# Patient Record
Sex: Female | Born: 1940 | Race: White | Hispanic: No | Marital: Married | Smoking: Never smoker
Health system: Southern US, Community
[De-identification: ages and names within clinical notes are randomized; demographics above are authoritative.]

## PROBLEM LIST (undated history)

## (undated) ENCOUNTER — Ambulatory Visit: Admission: EM | Payer: Medicare Other | Source: Home / Self Care

## (undated) DIAGNOSIS — G250 Essential tremor: Secondary | ICD-10-CM

## (undated) DIAGNOSIS — T7840XA Allergy, unspecified, initial encounter: Secondary | ICD-10-CM

## (undated) DIAGNOSIS — C4491 Basal cell carcinoma of skin, unspecified: Secondary | ICD-10-CM

## (undated) DIAGNOSIS — H269 Unspecified cataract: Secondary | ICD-10-CM

## (undated) DIAGNOSIS — C801 Malignant (primary) neoplasm, unspecified: Secondary | ICD-10-CM

## (undated) DIAGNOSIS — N189 Chronic kidney disease, unspecified: Secondary | ICD-10-CM

## (undated) HISTORY — DX: Chronic kidney disease, unspecified: N18.9

## (undated) HISTORY — DX: Essential tremor: G25.0

## (undated) HISTORY — DX: Allergy, unspecified, initial encounter: T78.40XA

## (undated) HISTORY — DX: Basal cell carcinoma of skin, unspecified: C44.91

## (undated) HISTORY — DX: Unspecified cataract: H26.9

## (undated) HISTORY — DX: Malignant (primary) neoplasm, unspecified: C80.1

---
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
## 1971-01-17 HISTORY — PX: DILATION AND CURETTAGE OF UTERUS: SHX78

## 1974-01-16 HISTORY — PX: TUBAL LIGATION: SHX77

## 1996-01-17 HISTORY — PX: BREAST FIBROADENOMA SURGERY: SHX580

## 2000-08-17 ENCOUNTER — Encounter: Payer: Self-pay | Admitting: Emergency Medicine

## 2000-08-17 ENCOUNTER — Emergency Department (HOSPITAL_COMMUNITY): Admission: EM | Admit: 2000-08-17 | Discharge: 2000-08-17 | Payer: Self-pay | Admitting: Emergency Medicine

## 2001-01-16 HISTORY — PX: BASAL CELL CARCINOMA EXCISION: SHX1214

## 2001-01-16 HISTORY — PX: CHOLECYSTECTOMY: SHX55

## 2001-02-06 ENCOUNTER — Encounter: Payer: Self-pay | Admitting: *Deleted

## 2001-02-06 ENCOUNTER — Observation Stay (HOSPITAL_COMMUNITY): Admission: RE | Admit: 2001-02-06 | Discharge: 2001-02-07 | Payer: Self-pay | Admitting: *Deleted

## 2001-02-06 ENCOUNTER — Encounter (INDEPENDENT_AMBULATORY_CARE_PROVIDER_SITE_OTHER): Payer: Self-pay | Admitting: Specialist

## 2001-05-22 ENCOUNTER — Other Ambulatory Visit: Admission: RE | Admit: 2001-05-22 | Discharge: 2001-05-22 | Payer: Self-pay | Admitting: Internal Medicine

## 2003-11-09 ENCOUNTER — Other Ambulatory Visit: Admission: RE | Admit: 2003-11-09 | Discharge: 2003-11-09 | Payer: Self-pay | Admitting: Internal Medicine

## 2003-11-20 ENCOUNTER — Ambulatory Visit: Payer: Self-pay

## 2004-01-17 HISTORY — PX: COLONOSCOPY: SHX174

## 2004-02-08 ENCOUNTER — Ambulatory Visit: Payer: Self-pay | Admitting: Internal Medicine

## 2004-02-18 ENCOUNTER — Ambulatory Visit: Payer: Self-pay | Admitting: Internal Medicine

## 2004-03-07 ENCOUNTER — Ambulatory Visit: Payer: Self-pay | Admitting: Internal Medicine

## 2004-03-17 ENCOUNTER — Ambulatory Visit: Payer: Self-pay | Admitting: Internal Medicine

## 2004-03-31 ENCOUNTER — Ambulatory Visit: Payer: Self-pay | Admitting: Gastroenterology

## 2004-03-31 DIAGNOSIS — K5730 Diverticulosis of large intestine without perforation or abscess without bleeding: Secondary | ICD-10-CM | POA: Insufficient documentation

## 2004-04-14 ENCOUNTER — Ambulatory Visit: Payer: Self-pay | Admitting: Gastroenterology

## 2004-08-28 ENCOUNTER — Emergency Department (HOSPITAL_COMMUNITY): Admission: EM | Admit: 2004-08-28 | Discharge: 2004-08-28 | Payer: Self-pay | Admitting: Emergency Medicine

## 2004-09-01 ENCOUNTER — Ambulatory Visit: Payer: Self-pay | Admitting: Internal Medicine

## 2004-09-22 ENCOUNTER — Ambulatory Visit: Payer: Self-pay | Admitting: Internal Medicine

## 2004-11-28 ENCOUNTER — Ambulatory Visit: Payer: Self-pay | Admitting: Internal Medicine

## 2005-06-30 ENCOUNTER — Ambulatory Visit: Payer: Self-pay | Admitting: Internal Medicine

## 2006-01-16 HISTORY — PX: LITHOTRIPSY: SUR834

## 2006-01-16 HISTORY — PX: CYSTOSCOPY: SUR368

## 2007-01-17 HISTORY — PX: CARDIAC CATHETERIZATION: SHX172

## 2007-07-10 ENCOUNTER — Ambulatory Visit: Payer: Self-pay | Admitting: Cardiology

## 2007-07-10 ENCOUNTER — Observation Stay (HOSPITAL_COMMUNITY): Admission: AD | Admit: 2007-07-10 | Discharge: 2007-07-11 | Payer: Self-pay | Admitting: Cardiology

## 2008-01-17 HISTORY — PX: SIGMOIDOSCOPY: SUR1295

## 2008-04-29 DIAGNOSIS — E042 Nontoxic multinodular goiter: Secondary | ICD-10-CM | POA: Insufficient documentation

## 2008-07-29 ENCOUNTER — Ambulatory Visit: Payer: Self-pay | Admitting: Gastroenterology

## 2008-07-29 DIAGNOSIS — K625 Hemorrhage of anus and rectum: Secondary | ICD-10-CM

## 2008-07-30 ENCOUNTER — Ambulatory Visit: Payer: Self-pay | Admitting: Gastroenterology

## 2010-03-17 DEATH — deceased

## 2010-05-31 NOTE — Discharge Summary (Signed)
[name], [name] NO.:  [id number]   MEDICAL RECORD NO.:  [id number]          PATIENT TYPE:  INP   LOCATION:  2001                         FACILITY:  [facility]   PHYSICIAN:  Veverly Fells. Excell Seltzer, MD  DATE OF BIRTH:  [birth year]   DATE OF ADMISSION:  07/10/2007  DATE OF DISCHARGE:  07/11/2007                               DISCHARGE SUMMARY   PRIMARY CARDIOLOGIST:  Madolyn Frieze. Jens Som, MD, [facility]   PRIMARY CARE Fortune Torosian:  Dalbert Mayotte, MD   DISCHARGE DIAGNOSIS:  Chest pain.   SECONDARY DIAGNOSES:  1. Hyperlipidemia.  2. Depression.  3. Osteoporosis.   ALLERGIES:  No known drug allergies.   PROCEDURE:  Left heart cardiac catheterization.   HISTORY OF PRESENT ILLNESS:  A 70 year old Caucasian female without  prior cardiac history.  She does have a history of hyperlipidemia and  family history of coronary disease.  She was admitted on July 10, 2007,  following a 3-4 weeks history of epigastric and substernal chest  discomfort and heaviness that worsened on the morning of July 10, 2007.  She was seen in the office by Dr. Jens Som and decision was made to  admit her for further evaluation.   HOSPITAL COURSE:  The patient had recurrent episodes of chest discomfort  on the evening of admission.  ECG showed no acute changes and cardiac  markers remained negative.  Decision was made to perform cardiac  catheterization.  Catheterization was performed on July 11, 2007,  revealing normal coronary arteries with an EF of 65-70% with normal wall  motion.  Post catheterization, [name] has been ambulating without  recurrent symptoms or limitations.  We will recommend her that she  follow up with Dr. Drue Second in the next 1-2 weeks as she may require  outpatient GI evaluation.  She is; otherwise, being discharged home  today in good condition.   DISCHARGE LABS:  Hemoglobin 13.1, hematocrit 40.0, WBC 6.1, platelets  195, and MCV 87.0.  Sodium 140, potassium 3.7, chloride 109,  CO2 of 25,  BUN 10, and creatinine 0.71, and glucose 125.  INR 0.9, total bilirubin  0.8, alkaline phosphatase 80, AST 31, ALT 23, and albumin 3.7.  Cardiac  markers negative x2.  Calcium 9.3.  TSH 0.732.   DISPOSITION:  The patient is being discharged home today in good  condition.   FOLLOWUP PLANS AND APPOINTMENTS:  We have asked her to followup with Dr.  Dalbert Mayotte in the next 1-2 weeks.   DISCHARGE MEDICATIONS:  1. Zoloft 100 mg daily.  2. Vytorin 40/10 mg daily.  3. Fosamax 70 mg weekly.  4. Aspirin 81 mg daily.  5. Prilosec OTC 20 mg daily.   OUTSTANDING LAB STUDIES:  None.   DURATION OF DISCHARGE ENCOUNTERED:  A 40 minutes including physician  time.      [name], [name]      Veverly Fells. Excell Seltzer, MD  Electronically Signed    CB/MEDQ  D:  07/11/2007  T:  07/12/2007  Job:  [id number]   cc:   Dalbert Mayotte, M.D.

## 2010-05-31 NOTE — H&P (Signed)
[name], [name] NO.:  [id number]   MEDICAL RECORD NO.:  [id number]          PATIENT TYPE:  INP   LOCATION:  2001                         FACILITY:  [facility]   PHYSICIAN:  Madolyn Frieze. Jens Som, MD, FACCDATE OF BIRTH:  1940/03/09   DATE OF ADMISSION:  07/10/2007  DATE OF DISCHARGE:                              HISTORY & PHYSICAL   HISTORY OF PRESENT ILLNESS:  [name] is a very pleasant 70 year old  female who presents for evaluation of chest pain.  She has no prior  cardiac history.  She typically does not have dyspnea on exertion,  orthopnea, PND, pedal edema, palpitations, presyncope, syncope, or  exertional chest pain.  Over the past 3-4 weeks, she has had  intermittent epigastric/substernal chest pain.  It is described as  someone sitting on my chest.  The pain is not pleuritic or positional  nor it is related to food.  It is not exertional.  The pain does not  radiate.  There is no associated nausea, vomiting, shortness of breath,  or diaphoresis.  It can last anywhere from 10-15 minutes to 5 minutes.  Her most recent episode was this morning for approximately 5-10 minutes.  She apparently saw Dr. Ilsa Iha for these pains and had chest x-ray that  questioned pneumonia.  However, followup CAT scan was normal per the  patient's report.  I do not have those records available.  Note, she has  not had any recent trips or leg injury.  Because of the above, I was  asked to further evaluate.   MEDICATIONS:  Her medications at present include:  1. Fosamax 70 mg weekly.  2. Sertraline 100 mg p.o. daily.  3. Zocor 40 mg p.o. daily.   ALLERGIES:  She has no known drug allergies.   SOCIAL HISTORY:  She does not smoke.  She occasionally consumes alcohol.   FAMILY HISTORY:  Positive for coronary disease.  She had a brother who  had a myocardial infarction in his 68s.  Her father had myocardial  infarction in his late 54s.   PAST MEDICAL HISTORY:  There is no diabetes  mellitus or hypertension,  but there is hyperlipidemia.  She has had problems with reflux in the  past, as well as allergies.  She has had prior nephrolithiasis.  She has  had a prior basal cell carcinoma and history of depression.  She has had  a history of an appendectomy, tonsillectomy, tubal ligation, and  cholecystectomy.  She has had prior benign calcified mass removed from  her breast.   REVIEW OF SYSTEMS:  There is no headaches recently.  No fevers or  chills.  There is no productive cough or hemoptysis.  There is no  dysphagia, odynophagia, or hematochezia.  She has had a question of  black stools approximately 2 weeks ago.  There is no dysuria or  hematuria.  There is no rash or seizure activity.  There is no  orthopnea, PND or pedal edema.  There is no claudication noted.  Remaining systems are negative.   PHYSICAL EXAMINATION:  VITAL SIGNS:  Today shows a blood  pressure of  129/80 and pulse of 98.  She weighs 168 pounds.  GENERAL:  She is well-developed, well-nourished in no acute distress.  SKIN.  Skin is warm and dry.  She does not appear to be depressed.  There is no peripheral clubbing.  BACK:  Normal.  HEENT:  Normal with normal eyelids.  NECK:  Supple with a normal upstroke bilaterally.  She does have a left  carotid bruit.  There is no thyromegaly noted.  CHEST:  Clear to auscultation with normal expansion.  CARDIOVASCULAR:  Regular rate and regular rhythm.  There is a 1/6  systolic murmur at the lower left sternal border.  There is no S3 or S4.  There are no rubs noted.  ABDOMEN:  Nontender.  Positive bowel sounds.  No hepatosplenomegaly.  No  masses appreciated.  There is no abdominal bruit.  She has 2+ femoral  pulses bilaterally.  No bruits.  EXTREMITIES:  Show no edema that I can palpate.  No cords.  She has 2+  dorsalis pedis pulses bilaterally.  NEUROLOGIC:  Grossly intact.   Her electrocardiogram shows a sinus rhythm with occasional PACs.  There  are no  ST changes noted.   DIAGNOSIS:  1. Chest pain - [name]'s symptoms have both typical and atypical      features; however, she does have risk factors including a family      history with a brother having myocardial infarction in his early      17s.  She also has hyperlipidemia.  Her most recent episode was      this morning, and she has had multiple episodes over the past 3-4      weeks.  I therefore think the most prudent course of action would      be to admit the patient to [facility] and rule out      myocardial infarction with serial enzymes.  If her enzymes are      negative, then we will most likely risk stratify with a stress      Myoview.  If she did have recurrent symptoms, then I would favor      cardiac catheterization.  We will treat with aspirin and Lopressor,      but she will continue on her statin.  Note, she has also had 2      black stools 2 weeks ago.  We will add Protonix to medical regimen,      certainly a gastrointestinal etiology is possible.  We will plan to      guaiac all stool and she may need a gastrointestinal evaluation if      her cardiac workup is negative.  2. Hyperlipidemia - she will continue on her statin.  3. Left carotid bruit - I think she will need outpatient carotid      Dopplers.   We will make further recommendations once we have the results of her  enzymes.      Madolyn Frieze Jens Som, MD, [facility]  Electronically Signed     BSC/MEDQ  D:  07/10/2007  T:  07/11/2007  Job:  [id number]

## 2010-06-03 NOTE — Op Note (Signed)
[facility]  Patient:    [name], [name] Visit Number: [id number] MRN: [id number]          Service Type: SUR Location: 4W 0449 01 Attending Physician:  Kandis Mannan Dictated by:   Donnie Coffin Samuella Cota, M.D. Proc. Date: 02/06/01 Admit Date:  02/06/2001   CC:         Stacie Glaze, M.D. [facility]   Operative Report  CCS#:  [id number]  PREOPERATIVE DIAGNOSIS:  Chronic cholecystitis with cholelithiasis.  POSTOPERATIVE DIAGNOSIS:  Chronic cholecystitis with cholelithiasis.  OPERATION PERFORMED:  Laparoscopic cholecystectomy with operative cholangiogram.  SURGEON:  Maisie Fus B. Samuella Cota, M.D.  ASSISTANT:  Gita Kudo, M.D.  ANESTHESIA:  General, Dr. Almeta Monas and CRNA.  DESCRIPTION OF PROCEDURE:  The patient was taken to the operating room, placed on the table in supine position, and after satisfactory general anesthetic with intubation, the entire abdomen was prepped and draped as a sterile field. A small vertical infraumbilical incision was made through skin and subcutaneous tissue and midline fascia. The peritoneal cavity was entered and there were no adhesions to the anterior abdominal wall. A pursestring suture of #0 Vicryl was placed and the Hasson trocar placed into the abdomen. The abdomen was insufflated to 14 mmHg pressure. A second 10 mm trocar was placed just to the right of the midline and subxiphoid area. Two 5 mm trocars were placed laterally. The gallbladder was covered with omentum and adhesions and these were taken down with gentle dissection. The patient had some adhesions to the undersurface of the liver medially which had to be taken down also. The cystic duct was then dissected free and appeared fairly long. A hemoclip was placed on the gallbladder side of the cystic duct and a small opening made into the cystic duct. [facility] was introduced through the abdominal wall and placed into the cystic duct and held with  endoclips. Using real-time C-arm fluoroscopy, operative cholangiogram was carried out which revealed good filling of the entire biliary system with good spillage of the contrast into the duodenum. The patient had a fairly long cystic duct. After the cholangiogram had been reviewed, the cholangiocath was removed and the cystic duct triply clipped on the remaining side and divided. The patient had a couple of small arteries which were clipped and then a large cystic artery was identified which was doubly clipped and divided. The gallbladder was dissected from the bed and bleeding controlled with the cautery. The gallbladder was easily removed through the infraumbilical incision. The two lateral trocars were removed after the fluid in the right upper quadrant had been suctioned. The pursestring suture at the infraumbilical incision was tied, a 0.25% Marcaine without epinephrine had been injected in all trocar sites. The last 10 mm trocar was removed and the skin incisions were closed. The two midline incisions were closed with running subcuticular sutures of 4-0 Vicryl and the two lateral trocar sites were closed with a single simple inverted 4-0 Vicryl. Benzoin and 0.25 inch Steri-Strips were applied. The patient seemed to tolerate the procedure well and was taken to the PACU in satisfactory condition. Dictated by:   Donnie Coffin Samuella Cota, M.D. Attending Physician:  Kandis Mannan DD:  02/06/01 TD:  02/07/01 Job: [id number]

## 2010-10-13 LAB — COMPREHENSIVE METABOLIC PANEL
AST: 31
BUN: 10
CO2: 25
Calcium: 9.3
Creatinine, Ser: 0.71
GFR calc Af Amer: >60
GFR calc non Af Amer: >60

## 2010-10-13 LAB — CBC
HCT: 40
MCHC: 34.4
MCV: 87
Platelets: 195
RBC: 4.59

## 2010-10-13 LAB — CARDIAC PANEL(CRET KIN+CKTOT+MB+TROPI)
CK, MB: 1.8
Relative Index: INVALID
Troponin I: 0.02
Troponin I: 0.03

## 2010-10-13 LAB — TSH: TSH: 0.732

## 2010-10-13 LAB — PROTIME-INR: INR: 0.9

## 2011-03-01 ENCOUNTER — Encounter: Payer: Self-pay | Admitting: Gastroenterology

## 2012-08-05 DIAGNOSIS — E782 Mixed hyperlipidemia: Secondary | ICD-10-CM | POA: Insufficient documentation

## 2013-01-01 DIAGNOSIS — M810 Age-related osteoporosis without current pathological fracture: Secondary | ICD-10-CM | POA: Insufficient documentation

## 2013-01-16 HISTORY — PX: LITHOTRIPSY: SUR834

## 2013-01-16 HISTORY — PX: BLEPHAROPLASTY: SUR158

## 2013-02-10 DIAGNOSIS — R0989 Other specified symptoms and signs involving the circulatory and respiratory systems: Secondary | ICD-10-CM | POA: Insufficient documentation

## 2013-08-18 DIAGNOSIS — N200 Calculus of kidney: Secondary | ICD-10-CM | POA: Insufficient documentation

## 2013-12-17 DIAGNOSIS — N201 Calculus of ureter: Secondary | ICD-10-CM | POA: Insufficient documentation

## 2014-03-04 ENCOUNTER — Encounter: Payer: Self-pay | Admitting: Gastroenterology

## 2014-03-19 ENCOUNTER — Encounter: Payer: Self-pay | Admitting: Gastroenterology

## 2014-04-06 ENCOUNTER — Ambulatory Visit (AMBULATORY_SURGERY_CENTER): Payer: Medicare Other | Admitting: *Deleted

## 2014-04-06 VITALS — Ht 65.0 in | Wt 159.0 lb

## 2014-04-06 DIAGNOSIS — Z1211 Encounter for screening for malignant neoplasm of colon: Secondary | ICD-10-CM

## 2014-04-06 MED ORDER — NA SULFATE-K SULFATE-MG SULF 17.5-3.13-1.6 GM/177ML PO SOLN: 1.0000 | Freq: Once | ORAL | Status: DC

## 2014-04-06 NOTE — Progress Notes (Signed)
No egg or soy allergy No home 02 use No diet pills No issues with past sedation, no issues with intubation  emmi video to e mail

## 2014-04-17 ENCOUNTER — Telehealth: Payer: Self-pay | Admitting: Gastroenterology

## 2014-04-17 NOTE — Telephone Encounter (Signed)
Called patient and explained that prescription had only been ordered one time on this end, not sure why she was notified it had been reordered. She stated that it was a "robocall", offered to call pharmacy and let them know we did not reorder as patient has already picked up, she states she is fine with just letting it be and they will eventually restock.

## 2014-04-20 ENCOUNTER — Encounter: Payer: Self-pay | Admitting: Gastroenterology

## 2014-04-20 ENCOUNTER — Ambulatory Visit (AMBULATORY_SURGERY_CENTER): Payer: Medicare Other | Admitting: Gastroenterology

## 2014-04-20 VITALS — BP 120/75 | HR 78 | Temp 96.7°F | Resp 13 | Ht 65.0 in | Wt 159.0 lb

## 2014-04-20 DIAGNOSIS — Z1211 Encounter for screening for malignant neoplasm of colon: Secondary | ICD-10-CM

## 2014-04-20 DIAGNOSIS — K5730 Diverticulosis of large intestine without perforation or abscess without bleeding: Secondary | ICD-10-CM

## 2014-04-20 MED ORDER — SODIUM CHLORIDE 0.9 % IV SOLN: 500.0000 mL | INTRAVENOUS | Status: DC

## 2014-04-20 NOTE — Progress Notes (Signed)
Report to PACU, RN, vss, BBS= Clear.  

## 2014-04-20 NOTE — Op Note (Signed)
[place]  [address] [place], [id number]   COLONOSCOPY PROCEDURE REPORT  PATIENT: [name], [name]  MR#: [id number] BIRTHDATE: 06/03/1940 , 74  yrs. old GENDER: female ENDOSCOPIST: Inda Castle, MD REFERRED BY: PROCEDURE DATE:  04/20/2014 PROCEDURE:   Colonoscopy, screening First Screening Colonoscopy - Avg.  risk and is 50 yrs.  old or older - No.  Prior Negative Screening - Now for repeat screening. 10 or more years since last screening  History of Adenoma - Now for follow-up colonoscopy & has been > or = to 3 yrs.  N/A ASA CLASS:   Class II INDICATIONS:Colorectal Neoplasm Risk Assessment for this procedure is average risk. MEDICATIONS: Monitored anesthesia care and Propofol 230 mg IV  DESCRIPTION OF PROCEDURE:   After the risks benefits and alternatives of the procedure were thoroughly explained, informed consent was obtained.  The digital rectal exam revealed no abnormalities of the rectum.   The LB [id number]  endoscope was introduced through the anus and advanced to the ileum. No adverse events experienced.   The quality of the prep was (Suprep was used) excellent.  The instrument was then slowly withdrawn as the colon was fully examined.      COLON FINDINGS: There was severe diverticulosis noted in the descending colon and sigmoid colon with associated luminal narrowing and muscular hypertrophy.   Internal hemorrhoids were found.   The examination was otherwise normal.  Retroflexed views revealed no abnormalities. The time to cecum = 6.7 Withdrawal time = 7.2   The scope was withdrawn and the procedure completed. COMPLICATIONS: There were no immediate complications.  ENDOSCOPIC IMPRESSION: 1.   There was severe diverticulosis noted in the descending colon and sigmoid colon 2.   Internal hemorrhoids 3.   The examination was otherwise normal  RECOMMENDATIONS: Given your age, you will not need another colonoscopy for colon cancer  screening or polyp surveillance.  These types of tests usually stop around the age 48.  eSigned:  Inda Castle, MD 04/20/2014 2:44 PM   cc: Julie Martinique, MD

## 2014-04-20 NOTE — Patient Instructions (Signed)
YOU HAD AN ENDOSCOPIC PROCEDURE TODAY AT [facility]:   Refer to the procedure report that was given to you for any specific questions about what was found during the examination.  If the procedure report does not answer your questions, please call your gastroenterologist to clarify.  If you requested that your care partner not be given the details of your procedure findings, then the procedure report has been included in a sealed envelope for you to review at your convenience later.  YOU SHOULD EXPECT: Some feelings of bloating in the abdomen. Passage of more gas than usual.  Walking can help get rid of the air that was put into your GI tract during the procedure and reduce the bloating. If you had a lower endoscopy (such as a colonoscopy or flexible sigmoidoscopy) you may notice spotting of blood in your stool or on the toilet paper. If you underwent a bowel prep for your procedure, you may not have a normal bowel movement for a few days.  Please Note:  You might notice some irritation and congestion in your nose or some drainage.  This is from the oxygen used during your procedure.  There is no need for concern and it should clear up in a day or so.  SYMPTOMS TO REPORT IMMEDIATELY:   Following lower endoscopy (colonoscopy or flexible sigmoidoscopy):  Excessive amounts of blood in the stool  Significant tenderness or worsening of abdominal pains  Swelling of the abdomen that is new, acute  Fever of 100F or higher    For urgent or emergent issues, a gastroenterologist can be reached at any hour by calling [phone].   DIET: Your first meal following the procedure should be a small meal and then it is ok to progress to your normal diet. Heavy or fried foods are harder to digest and may make you feel nauseous or bloated.  Likewise, meals heavy in dairy and vegetables can increase bloating.  Drink plenty of fluids but you should avoid alcoholic beverages for 24  hours.  ACTIVITY:  You should plan to take it easy for the rest of today and you should NOT DRIVE or use heavy machinery until tomorrow (because of the sedation medicines used during the test).    FOLLOW UP: Our staff will call the number listed on your records the next business day following your procedure to check on you and address any questions or concerns that you may have regarding the information given to you following your procedure. If we do not reach you, we will leave a message.  However, if you are feeling well and you are not experiencing any problems, there is no need to return our call.  We will assume that you have returned to your regular daily activities without incident.  If any biopsies were taken you will be contacted by phone or by letter within the next 1-3 weeks.  Please call us at [phone] if you have not heard about the biopsies in 3 weeks.    SIGNATURES/CONFIDENTIALITY: You and/or your care partner have signed paperwork which will be entered into your electronic medical record.  These signatures attest to the fact that that the information above on your After Visit Summary has been reviewed and is understood.  Full responsibility of the confidentiality of this discharge information lies with you and/or your care-partner.  INFORMATION ON DIVERTICULOSIS ,HEMORRHOIDS ,& HIGH FIBER DIET GIVEN TO YOU TODAY

## 2014-04-21 ENCOUNTER — Telehealth: Payer: Self-pay | Admitting: *Deleted

## 2014-04-21 NOTE — Telephone Encounter (Signed)
  Follow up Call-  Call back number 04/20/2014  Post procedure Call Back phone  # [phone]  Permission to leave phone message Yes     Patient questions:  Do you have a fever, pain , or abdominal swelling? No. Pain Score  0 *  Have you tolerated food without any problems? Yes.    Have you been able to return to your normal activities? Yes.    Do you have any questions about your discharge instructions: Diet   No. Medications  No. Follow up visit  No.  Do you have questions or concerns about your Care? No.  Actions: * If pain score is 4 or above: No action needed, pain <4.

## 2014-08-06 ENCOUNTER — Encounter: Payer: Self-pay | Admitting: Gastroenterology

## 2018-01-16 DEATH — deceased

## 2018-08-12 DIAGNOSIS — F325 Major depressive disorder, single episode, in full remission: Secondary | ICD-10-CM | POA: Insufficient documentation

## 2019-03-29 ENCOUNTER — Emergency Department
Admission: EM | Admit: 2019-03-29 | Discharge: 2019-03-29 | Disposition: A | Discharge status: Home / Self Care | Payer: Medicare Other | Source: Home / Self Care | Attending: Family Medicine | Admitting: Family Medicine

## 2019-03-29 ENCOUNTER — Emergency Department (INDEPENDENT_AMBULATORY_CARE_PROVIDER_SITE_OTHER): Payer: Medicare Other

## 2019-03-29 ENCOUNTER — Other Ambulatory Visit: Payer: Self-pay

## 2019-03-29 DIAGNOSIS — S52501A Unspecified fracture of the lower end of right radius, initial encounter for closed fracture: Secondary | ICD-10-CM

## 2019-03-29 NOTE — ED Triage Notes (Signed)
Pt c/o RT wrist pain since last night after falling at home. Pain 5/10.

## 2019-03-29 NOTE — Discharge Instructions (Addendum)
Elevate hand/wrist.  Wear brace.  Apply ice pack for 20 to 30 minutes, 3 to 4 times daily  Continue until pain and swelling decrease.  May take Tylenol as needed for pain.  Discuss vitamin D management with your family doctor.

## 2019-03-29 NOTE — ED Provider Notes (Signed)
[name] C[facility]    CSN: [id number] Arrival date & time: 03/29/19  1013      History   Chief Complaint Chief Complaint  Patient presents with   Wrist Pain    RT    HPI [name] is a 79 y.o. female.   While beginning to sit in her computer chair last night, the chair rolled sideways and patient fell.  While bracing with her right hand, she had sudden pain in her right wrist that has persisted.  The history is provided by the patient.  Wrist Pain Episode onset: 12 hours ago. The problem occurs constantly. The problem has not changed since onset.Associated symptoms comments: Decreased range of motion wrist. Exacerbated by: right wrist movement. Nothing relieves the symptoms. She has tried nothing for the symptoms.    Past Medical History:  Diagnosis Date   Allergy    Cancer ([place])    basal cell    Cataract    beginning   Chronic kidney disease    kidney stones    Patient Active Problem List   Diagnosis Date Noted   HEMORRHAGE OF RECTUM AND ANUS 07/29/2008   DIVERTICULOSIS OF COLON 03/31/2004    Past Surgical History:  Procedure Laterality Date   APPENDECTOMY  1962   BASAL CELL CARCINOMA EXCISION  2003   BLEPHAROPLASTY  2015   upper/lower    [place]   CARDIAC CATHETERIZATION  2009   CHOLECYSTECTOMY  2003   COLONOSCOPY  2006   CYSTOSCOPY  2008   with stent   [place]   LITHOTRIPSY  2008   LITHOTRIPSY  2015   SIGMOIDOSCOPY  2010   [place]    OB History   No obstetric history on file.      Home Medications    Prior to Admission medications   Medication Sig Start Date End Date Taking? Authorizing Provider  aspirin EC 81 MG tablet Take 81 mg by mouth. 08/06/12   [provider]  calcium-vitamin D (OSCAL) 250-125 MG-UNIT per tablet Take 1,200 mg by mouth.    [provider]  Co-Enzyme Q-10 30 MG CAPS Take 30 mg  by mouth.    [provider]  Cyanocobalamin (B-12) 1000 MCG CAPS Take 500 mcg by mouth.    [provider]  FLUoxetine (PROZAC) 40 MG capsule Take 40 mg by mouth. 08/20/13   [provider]  Glucosamine HCl-MSM (GLUCOSAMINE-MSM) 375-250 MG TABS Take 1,500 mg by mouth.    [provider]  Magnesium Sulfate 70 MG CAPS Take 500 mg by mouth.    [provider]  Multiple Vitamin (MULTIVITAMIN) tablet Take 1 tablet by mouth.    [provider]  simvastatin (ZOCOR) 20 MG tablet Take 20 mg by mouth.    [provider]  UNABLE TO FIND HAIR-SKIN-NAILS VITAMIN    [provider]  UNABLE TO FIND Tumeric 1 tab qd    [provider]  UNABLE TO FIND Vision Essentials 1 tab tid    [provider]    Family History Family History  Problem Relation Age of Onset   Heart disease Mother    Diabetes Mother    Kidney disease Mother    Hypertension Mother    Heart disease Father 30   Colon cancer Paternal Aunt    Heart disease Paternal Uncle 38        passed  away 79    Heart disease Paternal Grandmother    Heart disease Paternal Grandfather    Heart disease Paternal Uncle    Heart disease Maternal Aunt    Heart disease Maternal Uncle    Brain cancer Maternal Aunt     Social History Social History   Tobacco Use   Smoking status: Never Smoker   Smokeless tobacco: Never Used  Substance Use Topics   Alcohol use: Yes    Alcohol/week: 0.0 standard drinks    Comment: occ glass of wine   Drug use: No     Allergies   Other   Review of Systems Review of Systems  Musculoskeletal: Positive for joint swelling.  Skin: Negative for color change and wound.  All other systems reviewed and are negative.    Physical Exam Triage Vital Signs ED Triage Vitals [03/29/19 1025]  Enc Vitals Group     BP 123/78     Pulse Rate 82     Resp 18     Temp 97.9 F (36.6 C)     Temp Source Oral     SpO2  97 %     Weight 163 lb (73.9 kg)     Height 5\' 5"  (1.651 m)     Head Circumference      Peak Flow      Pain Score 5     Pain Loc      Pain Edu?      Excl. in [place]?    No data found.  Updated Vital Signs BP 123/78 (BP Location: Right Arm)    Pulse 82    Temp 97.9 F (36.6 C) (Oral)    Resp 18    Ht 5\' 5"  (1.651 m)    Wt 73.9 kg    SpO2 97%    BMI 27.12 kg/m   Visual Acuity Right Eye Distance:   Left Eye Distance:   Bilateral Distance:    Right Eye Near:   Left Eye Near:    Bilateral Near:     Physical Exam Vitals and nursing note reviewed.  Constitutional:      General: She is not in acute distress. HENT:     Head: Atraumatic.     Nose: Nose normal.  Eyes:     Pupils: Pupils are equal, round, and reactive to light.  Cardiovascular:     Rate and Rhythm: Normal rate.  Pulmonary:     Effort: Pulmonary effort is normal.  Musculoskeletal:     Right wrist: Swelling, tenderness, bony tenderness and snuff box tenderness present. No deformity, lacerations or crepitus. Decreased range of motion. Normal pulse.       Hands:     Comments: Right wrist has decreased range of motion.  There is tenderness to palpation and mild swelling over the dorsal distal radius.  Mild tenderness over the snuffbox.  Distal neurovascular function is intact.   Skin:    General: Skin is warm and dry.  Neurological:     Mental Status: She is alert.      UC Treatments / Results  Labs (all labs ordered are listed, but only abnormal results are displayed) Labs Reviewed - No data to display  EKG   Radiology DG Wrist Complete Right  Result Date: 03/29/2019 CLINICAL DATA:  Pt c/o RT wrist pain since last night after falling at home. Pain is around the distal radius and radiates up midshaft of the forearm. EXAM: RIGHT WRIST - COMPLETE 3+ VIEW COMPARISON:  None. FINDINGS:  They are subtle cortical step-offs on the lateral view along the dorsal and volar margins of the distal radial metaphysis, with  transverse trabecular irregularity also noted crossing the distal radial metaphysis on the AP and obliques views. Findings are consistent with a subtle nondisplaced, non comminuted fracture. No other evidence of a fracture.  No bone lesion. There are degenerative changes at the scaphoid, trapezium, trapezoid articulation. Remaining joints are normally spaced and aligned. Skeletal structures are demineralized. Mild wrist soft tissue swelling. IMPRESSION: 1. Subtle nondisplaced, non comminuted fracture of the distal right radial metaphysis. No dislocation. Electronically Signed   By: Lajean Manes M.D.   On: 03/29/2019 11:17    Procedures Procedures (including critical care time)  Medications Ordered in UC Medications - No data to display  Initial Impression / Assessment and Plan / UC Course  I have reviewed the triage vital signs and the nursing notes.  Pertinent labs & imaging results that were available during my care of the patient were reviewed by me and considered in my medical decision making (see chart for details).    Wrist splint applied. Review of records reveals last Vitamin D 25=OH 24ng/mL on 08/13/18. Followup with Dr. Aundria Mems ([facility]) for fracture management.   Final Clinical Impressions(s) / UC Diagnoses   Final diagnoses:  Closed fracture of distal end of right radius, unspecified fracture morphology, initial encounter     Discharge Instructions     Elevate hand/wrist.  Wear brace.  Apply ice pack for 20 to 30 minutes, 3 to 4 times daily  Continue until pain and swelling decrease.  May take Tylenol as needed for pain.  Discuss vitamin D management with your family doctor.    ED Prescriptions    None        Kandra Nicolas, MD 03/29/19 1203

## 2019-04-01 ENCOUNTER — Ambulatory Visit (INDEPENDENT_AMBULATORY_CARE_PROVIDER_SITE_OTHER): Payer: Medicare Other | Admitting: Sports Medicine

## 2019-04-01 ENCOUNTER — Other Ambulatory Visit: Payer: Self-pay

## 2019-04-01 DIAGNOSIS — S52591A Other fractures of lower end of right radius, initial encounter for closed fracture: Secondary | ICD-10-CM

## 2019-04-01 NOTE — Progress Notes (Signed)
    Procedures performed today:    None.  Independent interpretation of notes and tests performed by another provider:   I personally reviewed her x-rays, they show a nondisplaced extra-articular fracture through the right distal radial metaphysis.  Impression and Recommendations:    Closed fracture of metaphysis of distal end of right radius 4 days ago fell onto an outstretched right hand, had immediate pain, swelling, bruising. Seen in urgent care where x-rays showed a nondisplaced, nonangulated fracture of her right distal radial metaphysis. There is really no swelling so we placed her in an Exos cast today with plans for 4 to 6 weeks of casting. Return to see me in 1 month, x-ray before visit, we will consider some time out of the cast or transition back into her Velcro brace at that time.  I billed a fracture code for this encounter, all subsequent visits will be post-op checks in the global period.    ___________________________________________ Gwen Her. Dianah Field, M.D., ABFM., CAQSM. Primary Care and [place] Instructor of [place] of [facility] [employer]

## 2019-04-01 NOTE — Assessment & Plan Note (Signed)
4 days ago fell onto an outstretched right hand, had immediate pain, swelling, bruising. Seen in urgent care where x-rays showed a nondisplaced, nonangulated fracture of her right distal radial metaphysis. There is really no swelling so we placed her in an Exos cast today with plans for 4 to 6 weeks of casting. Return to see me in 1 month, x-ray before visit, we will consider some time out of the cast or transition back into her Velcro brace at that time.  I billed a fracture code for this encounter, all subsequent visits will be post-op checks in the global period.

## 2019-04-01 NOTE — Patient Instructions (Signed)
Radial Fracture  A radial fracture is a break in the radius bone. The radius is a bone in the forearm, on the same side as the thumb. The forearm is the part of the arm that is between the elbow and the wrist. A radial fracture near the wrist (distal radialfracture) is the most common type of broken arm. A fracture can also occur near the elbow (radial head fracture). What are the causes? The most common cause of a radial fracture is falling with the arm outstretched. Other causes include:  An accident, such as a car or bike accident.  A hard, direct hit to the forearm. What increases the risk? You may be at greater risk for a radial fracture if you:  Are female.  Are an older adult.  Play contact sports.  Have a condition that causes your bones to become thin and brittle (osteoporosis). What are the signs or symptoms? A radial fracture causes pain immediately after the injury. Other signs and symptoms may include:  An abnormal bend or bump in the arm (deformity).  Swelling.  Bruising.  Numbness or tingling in your arm and hand.  Limited movement of your arm and hand. How is this diagnosed? This condition may be diagnosed based on:  Your symptoms and medical history.  A physical exam.  An X-ray. How is this treated? Treatment depends on how severe your fracture is, where it is, and how the pieces of the broken bone line up with each other (alignment).  The first step may be for you to wear a temporary splint for a few days, until your swelling goes down. After the swelling goes down, you may get a cast, get a different type of splint, or have surgery.  If your broken bone is in good alignment, you will need to wear a splint or cast for up to 6 weeks.  If your broken bone is not aligned (is displaced), your health care provider will need to align the bone pieces. After alignment, you will need to wear a splint or cast for up to 6 weeks. To align your broken bone, your  health care provider may: ? Move the bones back into position without surgery (closed reduction). ? Perform surgery to align the fracture and fix the bone pieces into place with metal screws, plates, or wires (open reduction and internal fixation, ORIF). ? Perform surgery to align the fracture and fix the bone pieces into place with pins that are attached to a stabilizing bar outside your skin (external fixation). Treatment may also include:  Having your cast changed after 2-3 weeks.  Physical therapy.  Follow-up visits and X-rays to make sure you are healing. Follow these instructions at home: If you have a splint:  Wear it as told by your health care provider. Remove it only as told by your health care provider.  Loosen the splint if your fingers tingle, become numb, or turn cold and blue.  Keep the splint clean and dry. If you have a cast:  Do not stick anything inside the cast to scratch your skin. Doing that increases your risk for infection.  Check the skin around the cast every day. Tell your health care provider about any concerns.  You may put lotion on dry skin around the edges of the cast. Do not put lotion on the skin underneath the cast.  Keep the cast clean and dry. Bathing  Do not take baths, swim, or use a hot tub until your health care  provider approves. Ask your health care provider if you may take showers. You may only be allowed to take sponge baths.  If your splint or cast is not waterproof: ? Do not let it get wet. ? Cover it with a watertight covering when you take a bath or a shower. Activity  Do not lift anything with your injured arm.  Do not use the injured arm to support your body weight until your health care provider says that you can.  Ask your health care provider what activities are safe for you during recovery, and ask what activities you need to avoid. Managing pain, stiffness, and swelling   If directed, put ice on painful areas: ? If  you have a removable splint, remove it as told by your health care provider. ? Put ice in a plastic bag. ? Place a towel between your skin and the bag, or between your cast and the bag. ? Leave the ice on for 20 minutes, 2-3 times a day.  Move your fingers often to avoid stiffness and to lessen swelling.  Raise (elevate) your arm above the level of your heart while you are sitting or lying down. General instructions  Do not put pressure on any part of the cast or splint until it is fully hardened, if applicable. This may take several hours.  Take over-the-counter and prescription medicines only as told by your health care provider.  Do not drive until your health care provider approves. You should not drive or use heavy machinery while taking prescription pain medicine.  Do not use any products that contain nicotine or tobacco, such as cigarettes and e-cigarettes. These can delay bone healing. If you need help quitting, ask your health care provider.  Keep all follow-up visits as told by your health care provider. This is important. Contact a health care provider if you have:  Pain that does not get better with medicine.  Swelling that gets worse.  A bad smell coming from your cast. Get help right away if:  You cannot move your fingers.  You have severe pain.  Your fingers or your hand: ? Become numb, cold, or pale. ? Turn a bluish color. Summary  A radial fracture is a break in the radius bone. The radius is in the forearm, on the same side as the thumb.  Treatment depends on how severe your fracture is, where it is, and how the pieces of the broken bone line up with each other.  A splint or cast may be needed to help the fracture heal. A more severe break may require surgery. This information is not intended to replace advice given to you by your health care provider. Make sure you discuss any questions you have with your health care provider. Document Revised: 12/27/2016  Document Reviewed: 12/27/2016 Elsevier Patient Education  2020 [employer].

## 2019-04-29 ENCOUNTER — Ambulatory Visit (INDEPENDENT_AMBULATORY_CARE_PROVIDER_SITE_OTHER): Payer: Medicare Other

## 2019-04-29 ENCOUNTER — Other Ambulatory Visit: Payer: Self-pay

## 2019-04-29 ENCOUNTER — Ambulatory Visit (INDEPENDENT_AMBULATORY_CARE_PROVIDER_SITE_OTHER): Payer: Medicare Other | Admitting: Sports Medicine

## 2019-04-29 DIAGNOSIS — S52591A Other fractures of lower end of right radius, initial encounter for closed fracture: Secondary | ICD-10-CM | POA: Diagnosis not present

## 2019-04-29 NOTE — Progress Notes (Signed)
   Impression and Recommendations:    I have performed independent interpretation of the relevant labs and imaging ordered by this patient's other providers.  Closed fracture of metaphysis of distal end of right radius This is a pleasant 79 year old female, she is now 4 weeks post fracture of the radial metaphysis, still hurting significantly, x-rays reviewed and are unchanged with the exception of a decreased fracture line. Continue Exos cast for at least another 2 weeks, return to see me, no x-ray needed. I did reassure her that these fractures can take 6 to 8 weeks to heal. No pain medication requested.    ___________________________________________ Gwen Her. Dianah Field, M.D., ABFM., CAQSM. Primary Care and Sports Medicine [facility]  Adjunct Professor of [place] of [facility] [employer]

## 2019-04-29 NOTE — Assessment & Plan Note (Signed)
This is a pleasant 79 year old female, she is now 4 weeks post fracture of the radial metaphysis, still hurting significantly, x-rays reviewed and are unchanged with the exception of a decreased fracture line. Continue Exos cast for at least another 2 weeks, return to see me, no x-ray needed. I did reassure her that these fractures can take 6 to 8 weeks to heal. No pain medication requested.

## 2019-05-13 ENCOUNTER — Ambulatory Visit (INDEPENDENT_AMBULATORY_CARE_PROVIDER_SITE_OTHER): Payer: Medicare Other

## 2019-05-13 ENCOUNTER — Ambulatory Visit (INDEPENDENT_AMBULATORY_CARE_PROVIDER_SITE_OTHER): Payer: Medicare Other | Admitting: Sports Medicine

## 2019-05-13 ENCOUNTER — Other Ambulatory Visit: Payer: Self-pay

## 2019-05-13 DIAGNOSIS — S52591A Other fractures of lower end of right radius, initial encounter for closed fracture: Secondary | ICD-10-CM

## 2019-05-13 MED ORDER — MELOXICAM 15 MG PO TABS: ORAL_TABLET | ORAL | 3 refills | Status: DC

## 2019-05-13 NOTE — Assessment & Plan Note (Signed)
This is a very pleasant 79 year old female, she is now 6 weeks post fracture of the radial metaphysis, she is improved compared to the last visit but still has significant discomfort at the fracture site. She has requested transition from the Exos cast into a Velcro brace which is appropriate, she has one with her. I would like a second set of x-rays today to ensure healing. Continue calcium and vitamin D supplement, adding meloxicam to ease her discomfort, return to see me in 1 month. She can discontinue the brace in 2 weeks and start some range of motion exercises.

## 2019-05-13 NOTE — Progress Notes (Signed)
    Procedures performed today:    None.  Independent interpretation of notes and tests performed by another provider:   None.  Brief History, Exam, Impression, and Recommendations:    Closed fracture of metaphysis of distal end of right radius This is a very pleasant 79 year old female, she is now 6 weeks post fracture of the radial metaphysis, she is improved compared to the last visit but still has significant discomfort at the fracture site. She has requested transition from the Exos cast into a Velcro brace which is appropriate, she has one with her. I would like a second set of x-rays today to ensure healing. Continue calcium and vitamin D supplement, adding meloxicam to ease her discomfort, return to see me in 1 month. She can discontinue the brace in 2 weeks and start some range of motion exercises.    ___________________________________________ Gwen Her. Dianah Field, M.D., ABFM., CAQSM. Primary Care and [place] Instructor of [place] of [facility]

## 2019-06-10 ENCOUNTER — Encounter: Payer: Self-pay | Admitting: Sports Medicine

## 2019-06-10 ENCOUNTER — Ambulatory Visit (INDEPENDENT_AMBULATORY_CARE_PROVIDER_SITE_OTHER): Payer: Medicare Other | Admitting: Sports Medicine

## 2019-06-10 DIAGNOSIS — S52591A Other fractures of lower end of right radius, initial encounter for closed fracture: Secondary | ICD-10-CM

## 2019-06-10 NOTE — Assessment & Plan Note (Signed)
[name] is a pleasant 79 year old female, approximately 10 weeks ago she had a distal radius fracture, x-rays at the last visit did show decreased appearance of the fracture line. Today she still has some discomfort, worse at night however she has been doing a good deal of painting. She does endorse some swelling and some paresthesias into her hand and fingertips. 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 I think her fracture is healed, I offered gabapentin +/- carpal tunnel injection, she declines for now and feels like she can live with it. She can return to see me in a couple of weeks if still having symptoms. I also explained to her that minimal swelling after such a severe fracture was completely normal.

## 2019-06-10 NOTE — Progress Notes (Signed)
    Procedures performed today:    None.  Independent interpretation of notes and tests performed by another provider:   None.  Brief History, Exam, Impression, and Recommendations:    Closed fracture of metaphysis of distal end of right radius [name] is a pleasant 79 year old female, approximately 10 weeks ago she had a distal radius fracture, x-rays at the last visit did show decreased appearance of the fracture line. Today she still has some discomfort, worse at night however she has been doing a good deal of painting. She does endorse some swelling and some paresthesias into her hand and fingertips. 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 I think her fracture is healed, I offered gabapentin +/- carpal tunnel injection, she declines for now and feels like she can live with it. She can return to see me in a couple of weeks if still having symptoms. I also explained to her that minimal swelling after such a severe fracture was completely normal.    ___________________________________________ Gwen Her. [name], M.D., ABFM., CAQSM. Primary Care and [place] Instructor of [place] of [facility] [employer]

## 2019-07-29 ENCOUNTER — Other Ambulatory Visit: Payer: Self-pay

## 2019-07-29 ENCOUNTER — Encounter: Payer: Self-pay | Admitting: Family Medicine

## 2019-07-29 ENCOUNTER — Ambulatory Visit (INDEPENDENT_AMBULATORY_CARE_PROVIDER_SITE_OTHER): Payer: Medicare Other | Admitting: Family Medicine

## 2019-07-29 VITALS — BP 131/70 | HR 63 | Ht 64.96 in | Wt 162.2 lb

## 2019-07-29 DIAGNOSIS — N941 Unspecified dyspareunia: Secondary | ICD-10-CM | POA: Diagnosis not present

## 2019-07-29 DIAGNOSIS — F325 Major depressive disorder, single episode, in full remission: Secondary | ICD-10-CM

## 2019-07-29 DIAGNOSIS — E042 Nontoxic multinodular goiter: Secondary | ICD-10-CM | POA: Diagnosis not present

## 2019-07-29 DIAGNOSIS — E782 Mixed hyperlipidemia: Secondary | ICD-10-CM | POA: Diagnosis not present

## 2019-07-29 MED ORDER — ESTRADIOL 0.1 MG/GM VA CREA: TOPICAL_CREAM | VAGINAL | 12 refills | Status: DC

## 2019-07-29 NOTE — Assessment & Plan Note (Signed)
She is doing well with fluoxetine.  She will try adding melatonin back on for insomnia Can consider trazodone as well if still continues to suffer with insomnia.

## 2019-07-29 NOTE — Assessment & Plan Note (Signed)
On simvastatin, tolerating well.  Update lipid panel today.

## 2019-07-29 NOTE — Patient Instructions (Signed)
Very nice to meet you today! Let's try topical estrogen to help with painful intercourse. If you have any bleeding let me know.  You can try melatonin and/or lemon balm for insomnia.  Voncille Lo makes a gummy with both of these.  Try voltaren gel to knees.  See me again in about 3 months.

## 2019-07-29 NOTE — Assessment & Plan Note (Signed)
Update TSH

## 2019-07-29 NOTE — Assessment & Plan Note (Signed)
Likely due to postmenopausal changes Will provide trial of estrace cream Discussed if having any bleeding to let me know Can consider osphena if not improving with this.

## 2019-07-29 NOTE — Progress Notes (Signed)
[name] - 79 y.o. female MRN [id number]  Date of birth: 1940/12/22  Subjective No chief complaint on file.   HPI [name] is a 79 y.o. female here today for initial visit.  She has a history of multinodular goiter, depression with anxiety, HLD and kidney stones.    -Depression/Insomnia:  Current management with fluoxetine 60mg  daily (40+20mg ).  She reports that she is doing fairly well with this.  She denies significant side effects from medication.  She does report difficulty sleeping.  This does not occur every night but often will wake up and have trouble getting back to sleep.  She does have some trouble going to sleep as well.  She has tried melatonin but only a couple of times.   -Painful intercourse:  Reports that intercourse have become painful for her.  Has some vaginal atrophy and dryness that she feels is contributing.  Denies discharge or dysuria.  Has tried personal lubricants which do help some.  She does still have all reproductive organs.  There is no family history of breast cancer.   She would to have updated labs today.   ROS:  A comprehensive ROS was completed and negative except as noted per HPI    Allergies  Allergen Reactions  . Other Hives    dristan-over the counter cold prep that is off the shelf now per pt,   . Pheniramine-Phenylephrine Other (See Comments)    Hives    Past Medical History:  Diagnosis Date  . Allergy   . Basal cell carcinoma   . Cancer ([place])    basal cell   . Cataract    beginning  . Chronic kidney disease    kidney stones    Past Surgical History:  Procedure Laterality Date  . APPENDECTOMY  1962  . BASAL CELL CARCINOMA EXCISION  2003  . BLEPHAROPLASTY  2015   upper/lower   . [place]  . CARDIAC CATHETERIZATION  2009  . CHOLECYSTECTOMY  2003  . COLONOSCOPY  2006  . CYSTOSCOPY  2008   with stent  . DILATION AND CURETTAGE OF UTERUS  1973  . LITHOTRIPSY  2008  . LITHOTRIPSY  2015   . SIGMOIDOSCOPY  2010  . TONSILLECTOMY  1951  . TUBAL LIGATION  1976    Social History   Socioeconomic History  . Marital status: Married    Spouse name: Not on file  . Number of children: 2  . Years of education: Not on file  . Highest education level: Not on file  Occupational History  . Occupation: Retired  Tobacco Use  . Smoking status: Never Smoker  . Smokeless tobacco: Never Used  Substance and Sexual Activity  . Alcohol use: Yes    Alcohol/week: 1.0 standard drink    Types: 1 Glasses of wine per week    Comment: occ glass of wine  . Drug use: No  . Sexual activity: Not Currently    Partners: Male  Other Topics Concern  . Not on file  Social History Narrative  . Not on file   Social Determinants of Health   Financial Resource Strain:   . Difficulty of Paying Living Expenses:   Food Insecurity:   . Worried About Charity fundraiser in the Last Year:   . Arboriculturist in the Last Year:   Transportation Needs:   . Film/video editor (Medical):   Marland Kitchen Lack of Transportation (Non-Medical):   Physical Activity:   .  Days of Exercise per Week:   . Minutes of Exercise per Session:   Stress:   . Feeling of Stress :   Social Connections:   . Frequency of Communication with Friends and Family:   . Frequency of Social Gatherings with Friends and Family:   . Attends Religious Services:   . Active Member of Clubs or Organizations:   . Attends Archivist Meetings:   Marland Kitchen Marital Status:     Family History  Problem Relation Age of Onset  . Heart disease Mother   . Diabetes Mother   . Kidney disease Mother   . Hypertension Mother   . Heart disease Father 45  . Heart attack Father   . Stroke Father   . Colon cancer Paternal Aunt   . Heart disease Paternal Uncle 61        passed away 60   . Heart disease Paternal Grandmother   . Heart disease Paternal Grandfather   . Heart disease Paternal Uncle   . Heart disease Maternal Aunt   . Heart disease  Maternal Uncle   . Brain cancer Maternal Aunt     Health Maintenance  Topic Date Due  . Hepatitis C Screening  Never done  . TETANUS/TDAP  Never done  . DEXA SCAN  Never done  . INFLUENZA VACCINE  08/17/2019  . COVID-19 Vaccine  Completed  . PNA vac Low Risk Adult  Completed     ----------------------------------------------------------------------------------------------------------------------------------------------------------------------------------------------------------------- Physical Exam BP 131/70 (BP Location: Left Arm, Patient Position: Sitting, Cuff Size: Large)   Pulse 63   Ht 5' 4.96" (1.65 m)   Wt 162 lb 3.2 oz (73.6 kg)   SpO2 95%   BMI 27.02 kg/m   Physical Exam Constitutional:      Appearance: Normal appearance.  HENT:     Head: Normocephalic and atraumatic.  Musculoskeletal:     Cervical back: Neck supple.  Skin:    General: Skin is warm and dry.  Neurological:     General: No focal deficit present.     Mental Status: She is alert.  Psychiatric:        Mood and Affect: Mood normal.        Behavior: Behavior normal.     ------------------------------------------------------------------------------------------------------------------------------------------------------------------------------------------------------------------- Assessment and Plan  Dyspareunia in female Likely due to postmenopausal changes Will provide trial of estrace cream Discussed if having any bleeding to let me know Can consider osphena if not improving with this.   Mixed hyperlipidemia On simvastatin, tolerating well.  Update lipid panel today.   Nontoxic multinodular goiter Update TSH  Major depressive disorder with single episode, in remission [facility]) She is doing well with fluoxetine.  She will try adding melatonin back on for insomnia Can consider trazodone as well if still continues to suffer with insomnia.    Meds ordered this encounter  Medications  .  estradiol (ESTRACE VAGINAL) 0.1 MG/GM vaginal cream    Sig: Use 2 g daily x2 weeks, then reduce to 1 g daily x2 weeks.  Continue 1 g three times per week for maintenance therafter.  Place vaginally.    Dispense:  42.5 g    Refill:  12    Return in about 3 months (around 10/29/2019) for Insomnia.    This visit occurred during the SARS-CoV-2 public health emergency.  Safety protocols were in place, including screening questions prior to the visit, additional usage of staff PPE, and extensive cleaning of exam room while observing appropriate contact time as indicated for  disinfecting solutions.

## 2019-07-30 LAB — COMPLETE METABOLIC PANEL WITH GFR
AG Ratio: 1.8 (calc) (ref 1.0–2.5)
ALT: 24 U/L (ref 6–29)
AST: 30 U/L (ref 10–35)
Albumin: 4.2 g/dL (ref 3.6–5.1)
Alkaline phosphatase (APISO): 97 U/L (ref 37–153)
BUN: 18 mg/dL (ref 7–25)
CO2: 29 mmol/L (ref 20–32)
Calcium: 10.1 mg/dL (ref 8.6–10.4)
Chloride: 104 mmol/L (ref 98–110)
Creat: 0.69 mg/dL (ref 0.60–0.93)
GFR, Est African American: 98 mL/min/{1.73_m2} (ref >=60)
GFR, Est Non African American: 85 mL/min/{1.73_m2} (ref >=60)
Globulin: 2.4 g/dL (calc) (ref 1.9–3.7)
Glucose, Bld: 91 mg/dL (ref 65–99)
Potassium: 4.6 mmol/L (ref 3.5–5.3)
Sodium: 141 mmol/L (ref 135–146)
Total Bilirubin: 1.4 mg/dL — ABNORMAL HIGH (ref 0.2–1.2)
Total Protein: 6.6 g/dL (ref 6.1–8.1)

## 2019-07-30 LAB — CBC
HCT: 42.9 % (ref 35.0–45.0)
Hemoglobin: 13.8 g/dL (ref 11.7–15.5)
MCH: 28.7 pg (ref 27.0–33.0)
MCHC: 32.2 g/dL (ref 32.0–36.0)
MCV: 89.2 fL (ref 80.0–100.0)
MPV: 10.6 fL (ref 7.5–12.5)
Platelets: 215 10*3/uL (ref 140–400)
RBC: 4.81 10*6/uL (ref 3.80–5.10)
RDW: 13.1 % (ref 11.0–15.0)
WBC: 6.4 10*3/uL (ref 3.8–10.8)

## 2019-07-30 LAB — LIPID PANEL
Cholesterol: 176 mg/dL (ref <200)
HDL: 75 mg/dL (ref >=50)
LDL Cholesterol (Calc): 78 mg/dL (calc)
Non-HDL Cholesterol (Calc): 101 mg/dL (calc) (ref <130)
Total CHOL/HDL Ratio: 2.3 (calc) (ref <5.0)
Triglycerides: 126 mg/dL (ref <150)

## 2019-07-30 LAB — TSH: TSH: 0.9 mIU/L (ref 0.40–4.50)

## 2019-08-06 ENCOUNTER — Encounter: Payer: Self-pay | Admitting: Family Medicine

## 2019-08-06 MED ORDER — FLUOXETINE HCL 20 MG PO CAPS: 20.0000 mg | ORAL_CAPSULE | Freq: Every day | ORAL | 0 refills | Status: DC

## 2019-08-06 NOTE — Telephone Encounter (Signed)
Rx written by historical provider. Rx pended.  

## 2019-08-29 ENCOUNTER — Encounter: Payer: Self-pay | Admitting: Family Medicine

## 2019-08-29 ENCOUNTER — Other Ambulatory Visit: Payer: Self-pay | Admitting: Medical-Surgical

## 2019-08-29 MED ORDER — FLUOXETINE HCL 40 MG PO CAPS: 40.0000 mg | ORAL_CAPSULE | Freq: Every day | ORAL | 1 refills | Status: DC

## 2019-08-29 NOTE — Telephone Encounter (Signed)
Pt requesting fluoxetine 40 mg. Written by historical provider. Rx pended.

## 2019-09-17 ENCOUNTER — Encounter: Payer: Self-pay | Admitting: Family Medicine

## 2019-09-17 NOTE — Telephone Encounter (Signed)
Recommend appoint to address this.

## 2019-09-18 ENCOUNTER — Ambulatory Visit (INDEPENDENT_AMBULATORY_CARE_PROVIDER_SITE_OTHER): Payer: Medicare Other

## 2019-09-18 ENCOUNTER — Other Ambulatory Visit: Payer: Self-pay

## 2019-09-18 ENCOUNTER — Encounter: Payer: Self-pay | Admitting: Family Medicine

## 2019-09-18 ENCOUNTER — Ambulatory Visit (INDEPENDENT_AMBULATORY_CARE_PROVIDER_SITE_OTHER): Payer: Medicare Other | Admitting: Family Medicine

## 2019-09-18 VITALS — BP 137/81 | HR 87 | Ht 65.0 in | Wt 162.0 lb

## 2019-09-18 DIAGNOSIS — M25461 Effusion, right knee: Secondary | ICD-10-CM | POA: Diagnosis not present

## 2019-09-18 NOTE — Progress Notes (Signed)
Patient would like calcium - vitamin D combo reordered.

## 2019-09-18 NOTE — Patient Instructions (Signed)
You appear to have good circulation to your legs and feet.  Have xray completed.  We'll be in touch with results.   Consider compression stockings to help with swelling.

## 2019-09-18 NOTE — Progress Notes (Signed)
[name] - 79 y.o. female MRN [id number]  Date of birth: 06-Nov-1940  Subjective Chief Complaint  Patient presents with  . Leg Pain    HPI [name] is a 79 y.o. female here today with complaint of R leg pain.  She has had mild swelling of the R leg for several years.  She recently began having pain in the right leg.  Has pain along calf, knee and inside of leg.  She does have some joint swelling of the knee.  She describes pain as burning sensation.  She denies rash, numbness or tingling.  She is concerned about possible PAD.  Pain is not worse with walking or exercise.  It tends to be worse at night when she is laying down.  She has been using voltaren on the knee which does provide some relief.    ROS:  A comprehensive ROS was completed and negative except as noted per HPI  Allergies  Allergen Reactions  . Other Hives    dristan-over the counter cold prep that is off the shelf now per pt,   . Pheniramine-Phenylephrine Other (See Comments)    Hives    Past Medical History:  Diagnosis Date  . Allergy   . Basal cell carcinoma   . Cancer ([place])    basal cell   . Cataract    beginning  . Chronic kidney disease    kidney stones    Past Surgical History:  Procedure Laterality Date  . APPENDECTOMY  1962  . BASAL CELL CARCINOMA EXCISION  2003  . BLEPHAROPLASTY  2015   upper/lower   . [place]  . CARDIAC CATHETERIZATION  2009  . CHOLECYSTECTOMY  2003  . COLONOSCOPY  2006  . CYSTOSCOPY  2008   with stent  . DILATION AND CURETTAGE OF UTERUS  1973  . LITHOTRIPSY  2008  . LITHOTRIPSY  2015  . SIGMOIDOSCOPY  2010  . TONSILLECTOMY  1951  . TUBAL LIGATION  1976    Social History   Socioeconomic History  . Marital status: Married    Spouse name: Not on file  . Number of children: 2  . Years of education: Not on file  . Highest education level: Not on file  Occupational History  . Occupation: Retired  Tobacco Use  . Smoking status:  Never Smoker  . Smokeless tobacco: Never Used  Substance and Sexual Activity  . Alcohol use: Yes    Alcohol/week: 1.0 standard drink    Types: 1 Glasses of wine per week    Comment: occ glass of wine  . Drug use: No  . Sexual activity: Not Currently    Partners: Male  Other Topics Concern  . Not on file  Social History Narrative  . Not on file   Social Determinants of Health   Financial Resource Strain:   . Difficulty of Paying Living Expenses: Not on file  Food Insecurity:   . Worried About Charity fundraiser in the Last Year: Not on file  . Ran Out of Food in the Last Year: Not on file  Transportation Needs:   . Lack of Transportation (Medical): Not on file  . Lack of Transportation (Non-Medical): Not on file  Physical Activity:   . Days of Exercise per Week: Not on file  . Minutes of Exercise per Session: Not on file  Stress:   . Feeling of Stress : Not on file  Social Connections:   . Frequency of  Communication with Friends and Family: Not on file  . Frequency of Social Gatherings with Friends and Family: Not on file  . Attends Religious Services: Not on file  . Active Member of Clubs or Organizations: Not on file  . Attends Archivist Meetings: Not on file  . Marital Status: Not on file    Family History  Problem Relation Age of Onset  . Heart disease Mother   . Diabetes Mother   . Kidney disease Mother   . Hypertension Mother   . Heart disease Father 24  . Heart attack Father   . Stroke Father   . Colon cancer Paternal Aunt   . Heart disease Paternal Uncle 70        passed away 19   . Heart disease Paternal Grandmother   . Heart disease Paternal Grandfather   . Heart disease Paternal Uncle   . Heart disease Maternal Aunt   . Heart disease Maternal Uncle   . Brain cancer Maternal Aunt     Health Maintenance  Topic Date Due  . Hepatitis C Screening  Never done  . TETANUS/TDAP  Never done  . DEXA SCAN  Never done  . INFLUENZA VACCINE   Never done  . COVID-19 Vaccine  Completed  . PNA vac Low Risk Adult  Completed     ----------------------------------------------------------------------------------------------------------------------------------------------------------------------------------------------------------------- Physical Exam BP 137/81 (BP Location: Left Arm, Patient Position: Sitting, Cuff Size: Normal)   Pulse 87   Ht 5\' 5"  (1.651 m)   Wt 162 lb (73.5 kg)   SpO2 98%   BMI 26.96 kg/m   Physical Exam Constitutional:      Appearance: Normal appearance.  Cardiovascular:     Rate and Rhythm: Normal rate and regular rhythm.  Pulmonary:     Effort: Pulmonary effort is normal.     Breath sounds: Normal breath sounds.  Musculoskeletal:     Cervical back: Neck supple.     Comments: Mild swelling of RLE compared to L.  No pitting edema.   DP and PT pulses 2+  R knee swelling present.   No palpable bakers cyst.   Skin:    General: Skin is warm and dry.  Neurological:     General: No focal deficit present.     Mental Status: She is alert.  Psychiatric:        Mood and Affect: Mood normal.        Behavior: Behavior normal.     ------------------------------------------------------------------------------------------------------------------------------------------------------------------------------------------------------------------- Assessment and Plan  Swelling of right knee joint Likely due to OA.  Xrays ordered She has some distal swelling as well, which is chronic.  DVT unlikely given chronicity.   Discussed that her pain does not seem consistent with PAD and she has strong peripheral pulses.   Burning pain may be indicative of neuropathy, can consider checking additional labs if symptoms persist.     No orders of the defined types were placed in this encounter.   No follow-ups on file.    This visit occurred during the SARS-CoV-2 public health emergency.  Safety protocols were in  place, including screening questions prior to the visit, additional usage of staff PPE, and extensive cleaning of exam room while observing appropriate contact time as indicated for disinfecting solutions.

## 2019-09-18 NOTE — Assessment & Plan Note (Signed)
Likely due to OA.  Xrays ordered She has some distal swelling as well, which is chronic.  DVT unlikely given chronicity.   Discussed that her pain does not seem consistent with PAD and she has strong peripheral pulses.   Burning pain may be indicative of neuropathy, can consider checking additional labs if symptoms persist.

## 2019-10-15 ENCOUNTER — Ambulatory Visit (INDEPENDENT_AMBULATORY_CARE_PROVIDER_SITE_OTHER): Payer: Medicare Other | Admitting: Family Medicine

## 2019-10-15 ENCOUNTER — Encounter: Payer: Self-pay | Admitting: Family Medicine

## 2019-10-15 ENCOUNTER — Other Ambulatory Visit: Payer: Self-pay | Admitting: Family Medicine

## 2019-10-15 DIAGNOSIS — Z23 Encounter for immunization: Secondary | ICD-10-CM | POA: Diagnosis not present

## 2019-10-15 DIAGNOSIS — R209 Unspecified disturbances of skin sensation: Secondary | ICD-10-CM | POA: Insufficient documentation

## 2019-10-15 LAB — SEDIMENTATION RATE: Sed Rate: 2 mm/h (ref 0–30)

## 2019-10-15 LAB — VITAMIN B12: Vitamin B-12: 827 pg/mL (ref 200–1100)

## 2019-10-15 NOTE — Progress Notes (Signed)
[name] - 79 y.o. female MRN [id number]  Date of birth: [birth year]  Subjective No chief complaint on file.   HPI [name] is a 79 y.o. female here today with complaint of continued burning, sharp pain in her R leg.  Previous imaging of R knee with small effusion but  no significant degenerative changes. She would like to have lab testing for causes of neuropathy.  She had previous normal blood sugar and TSH levels.    ROS:  A comprehensive ROS was completed and negative except as noted per HPI  Allergies  Allergen Reactions  . Other Hives    dristan-over the counter cold prep that is off the shelf now per pt,     Past Medical History:  Diagnosis Date  . Allergy   . Basal cell carcinoma   . Cancer ([place])    basal cell   . Cataract    beginning  . Chronic kidney disease    kidney stones    Past Surgical History:  Procedure Laterality Date  . APPENDECTOMY  1962  . BASAL CELL CARCINOMA EXCISION  2003  . BLEPHAROPLASTY  2015   upper/lower   . [place]  . CARDIAC CATHETERIZATION  2009  . CHOLECYSTECTOMY  2003  . COLONOSCOPY  2006  . CYSTOSCOPY  2008   with stent  . DILATION AND CURETTAGE OF UTERUS  1973  . LITHOTRIPSY  2008  . LITHOTRIPSY  2015  . SIGMOIDOSCOPY  2010  . TONSILLECTOMY  1951  . TUBAL LIGATION  1976    Social History   Socioeconomic History  . Marital status: Married    Spouse name: Not on file  . Number of children: 2  . Years of education: Not on file  . Highest education level: Not on file  Occupational History  . Occupation: Retired  Tobacco Use  . Smoking status: Never Smoker  . Smokeless tobacco: Never Used  Substance and Sexual Activity  . Alcohol use: Yes    Alcohol/week: 1.0 standard drink    Types: 1 Glasses of wine per week    Comment: occ glass of wine  . Drug use: No  . Sexual activity: Not Currently    Partners: Male  Other Topics Concern  . Not on file  Social History Narrative  .  Not on file   Social Determinants of Health   Financial Resource Strain:   . Difficulty of Paying Living Expenses: Not on file  Food Insecurity:   . Worried About Charity fundraiser in the Last Year: Not on file  . Ran Out of Food in the Last Year: Not on file  Transportation Needs:   . Lack of Transportation (Medical): Not on file  . Lack of Transportation (Non-Medical): Not on file  Physical Activity:   . Days of Exercise per Week: Not on file  . Minutes of Exercise per Session: Not on file  Stress:   . Feeling of Stress : Not on file  Social Connections:   . Frequency of Communication with Friends and Family: Not on file  . Frequency of Social Gatherings with Friends and Family: Not on file  . Attends Religious Services: Not on file  . Active Member of Clubs or Organizations: Not on file  . Attends Archivist Meetings: Not on file  . Marital Status: Not on file    Family History  Problem Relation Age of Onset  . Heart disease Mother   .  Diabetes Mother   . Kidney disease Mother   . Hypertension Mother   . Heart disease Father 51  . Heart attack Father   . Stroke Father   . Colon cancer Paternal Aunt   . Heart disease Paternal Uncle 38        passed away 2   . Heart disease Paternal Grandmother   . Heart disease Paternal Grandfather   . Heart disease Paternal Uncle   . Heart disease Maternal Aunt   . Heart disease Maternal Uncle   . Brain cancer Maternal Aunt     Health Maintenance  Topic Date Due  . Hepatitis C Screening  Never done  . TETANUS/TDAP  Never done  . DEXA SCAN  Never done  . INFLUENZA VACCINE  Completed  . COVID-19 Vaccine  Completed  . PNA vac Owens Risk Adult  Completed     ----------------------------------------------------------------------------------------------------------------------------------------------------------------------------------------------------------------- Physical Exam There were no vitals taken for this  visit.  Physical Exam Constitutional:      Appearance: Normal appearance.  Cardiovascular:     Rate and Rhythm: Normal rate and regular rhythm.  Pulmonary:     Effort: Pulmonary effort is normal.     Breath sounds: Normal breath sounds.  Neurological:     General: No focal deficit present.     Mental Status: She is alert.  Psychiatric:        Mood and Affect: Mood normal.        Behavior: Behavior normal.     ------------------------------------------------------------------------------------------------------------------------------------------------------------------------------------------------------------------- Assessment and Plan  Disturbance of skin sensation Symptoms suggestive of neuropathy.  Checking B12, ESR, and ANA today.  Discussed having EMG completed as well however she would like to hold off on this for now.     No orders of the defined types were placed in this encounter.   No follow-ups on file.    This visit occurred during the SARS-CoV-2 public health emergency.  Safety protocols were in place, including screening questions prior to the visit, additional usage of staff PPE, and extensive cleaning of exam room while observing appropriate contact time as indicated for disinfecting solutions.

## 2019-10-15 NOTE — Assessment & Plan Note (Signed)
Symptoms suggestive of neuropathy.  Checking B12, ESR, and ANA today.  Discussed having EMG completed as well however she would like to hold off on this for now.

## 2019-10-17 ENCOUNTER — Encounter: Payer: Self-pay | Admitting: Family Medicine

## 2019-10-17 LAB — SPECIMEN COMPROMISED

## 2019-10-17 LAB — ANA: Anti Nuclear Antibody (ANA): POSITIVE — AB

## 2019-10-17 LAB — ANTI-NUCLEAR AB-TITER (ANA TITER): ANA Titer 1: 1:80 {titer} — ABNORMAL HIGH

## 2019-10-20 ENCOUNTER — Encounter: Payer: Self-pay | Admitting: Family Medicine

## 2019-10-20 DIAGNOSIS — M25461 Effusion, right knee: Secondary | ICD-10-CM

## 2019-10-20 DIAGNOSIS — R768 Other specified abnormal immunological findings in serum: Secondary | ICD-10-CM

## 2019-10-21 NOTE — Telephone Encounter (Signed)
Referral signed.

## 2019-11-08 ENCOUNTER — Encounter: Payer: Self-pay | Admitting: Family Medicine

## 2019-11-10 MED ORDER — FLUOXETINE HCL 20 MG PO CAPS
20.0000 mg | ORAL_CAPSULE | Freq: Every day | ORAL | 0 refills | Status: DC
Start: 2019-11-10 — End: 2020-02-02

## 2019-11-11 ENCOUNTER — Other Ambulatory Visit: Payer: Self-pay | Admitting: Family Medicine

## 2019-11-11 MED ORDER — SIMVASTATIN 40 MG PO TABS
40.0000 mg | ORAL_TABLET | Freq: Every day | ORAL | 2 refills | Status: DC
Start: 2019-11-11 — End: 2020-07-29

## 2019-11-21 ENCOUNTER — Other Ambulatory Visit: Payer: Self-pay

## 2019-11-21 ENCOUNTER — Ambulatory Visit: Payer: Medicare Other | Admitting: Internal Medicine

## 2019-11-21 ENCOUNTER — Encounter: Payer: Self-pay | Admitting: Internal Medicine

## 2019-11-21 VITALS — BP 135/78 | HR 76 | Resp 15 | Ht 64.75 in | Wt 160.0 lb

## 2019-11-21 DIAGNOSIS — R768 Other specified abnormal immunological findings in serum: Secondary | ICD-10-CM | POA: Insufficient documentation

## 2019-11-21 DIAGNOSIS — M8000XS Age-related osteoporosis with current pathological fracture, unspecified site, sequela: Secondary | ICD-10-CM

## 2019-11-21 DIAGNOSIS — M25461 Effusion, right knee: Secondary | ICD-10-CM

## 2019-11-21 NOTE — Patient Instructions (Signed)
Knee pain sounds most consistent with osteoarthritis which is present on the ultrasound and physical examination today.  This is not considered an inflammatory arthritis but you can see small amounts of joint swelling intermittently for this.  Your peripheral neuropathy does not sound suggestive of an autoimmune disease cause.  May consider discussing this with your primary care doctor if they want to try any treatment for the symptoms.  You have a positive ANA test that can be associated with some types of autoimmune disease but I do not see evidence for this from talking with you or examination.  Based on this I do not recommend specific rheumatology work-up or any new medications at this time.   Osteoarthritis  Osteoarthritis is a type of arthritis that affects tissue that covers the ends of bones in joints (cartilage). Cartilage acts as a cushion between the bones and helps them move smoothly. Osteoarthritis results when cartilage in the joints gets worn down. Osteoarthritis is sometimes called "wear and tear" arthritis. Osteoarthritis is the most common form of arthritis. It often occurs in older people. It is a condition that gets worse over time (a progressive condition). Joints that are most often affected by this condition are in:  Fingers.  Toes.  Hips.  Knees.  Spine, including neck and lower back. What are the causes? This condition is caused by age-related wearing down of cartilage that covers the ends of bones. What increases the risk? The following factors may make you more likely to develop this condition:  Older age.  Being overweight or obese.  Overuse of joints, such as in athletes.  Past injury of a joint.  Past surgery on a joint.  Family history of osteoarthritis. What are the signs or symptoms? The main symptoms of this condition are pain, swelling, and stiffness in the joint. The joint may lose its shape over time. Small pieces of bone or cartilage may  break off and float inside of the joint, which may cause more pain and damage to the joint. Small deposits of bone (osteophytes) may grow on the edges of the joint. Other symptoms may include:  A grating or scraping feeling inside the joint when you move it.  Popping or creaking sounds when you move. Symptoms may affect one or more joints. Osteoarthritis in a major joint, such as your knee or hip, can make it painful to walk or exercise. If you have osteoarthritis in your hands, you might not be able to grip items, twist your hand, or control small movements of your hands and fingers (fine motor skills). How is this diagnosed? This condition may be diagnosed based on:  Your medical history.  A physical exam.  Your symptoms.  X-rays of the affected joint(s).  Blood tests to rule out other types of arthritis. How is this treated? There is no cure for this condition, but treatment can help to control pain and improve joint function. Treatment plans may include:  A prescribed exercise program that allows for rest and joint relief. You may work with a physical therapist.  A weight control plan.  Pain relief techniques, such as: ? Applying heat and cold to the joint. ? Electric pulses delivered to nerve endings under the skin (transcutaneous electrical nerve stimulation, or TENS). ? Massage. ? Certain nutritional supplements.  NSAIDs or prescription medicines to help relieve pain.  Medicine to help relieve pain and inflammation (corticosteroids). This can be given by mouth (orally) or as an injection.  Assistive devices, such as a  brace, wrap, splint, specialized glove, or cane.  Surgery, such as: ? An osteotomy. This is done to reposition the bones and relieve pain or to remove loose pieces of bone and cartilage. ? Joint replacement surgery. You may need this surgery if you have very bad (advanced) osteoarthritis. Follow these instructions at home: Activity  Rest your affected  joints as directed by your health care provider.  Do not drive or use heavy machinery while taking prescription pain medicine.  Exercise as directed. Your health care provider or physical therapist may recommend specific types of exercise, such as: ? Strengthening exercises. These are done to strengthen the muscles that support joints that are affected by arthritis. They can be performed with weights or with exercise bands to add resistance. ? Aerobic activities. These are exercises, such as brisk walking or water aerobics, that get your heart pumping. ? Range-of-motion activities. These keep your joints easy to move. ? Balance and agility exercises. Managing pain, stiffness, and swelling      If directed, apply heat to the affected area as often as told by your health care provider. Use the heat source that your health care provider recommends, such as a moist heat pack or a heating pad. ? If you have a removable assistive device, remove it as told by your health care provider. ? Place a towel between your skin and the heat source. If your health care provider tells you to keep the assistive device on while you apply heat, place a towel between the assistive device and the heat source. ? Leave the heat on for 20-30 minutes. ? Remove the heat if your skin turns bright red. This is especially important if you are unable to feel pain, heat, or cold. You may have a greater risk of getting burned.  If directed, put ice on the affected joint: ? If you have a removable assistive device, remove it as told by your health care provider. ? Put ice in a plastic bag. ? Place a towel between your skin and the bag. If your health care provider tells you to keep the assistive device on during icing, place a towel between the assistive device and the bag. ? Leave the ice on for 20 minutes, 2-3 times a day. General instructions  Take over-the-counter and prescription medicines only as told by your health  care provider.  Maintain a healthy weight. Follow instructions from your health care provider for weight control. These may include dietary restrictions.  Do not use any products that contain nicotine or tobacco, such as cigarettes and e-cigarettes. These can delay bone healing. If you need help quitting, ask your health care provider.  Use assistive devices as directed by your health care provider.  Keep all follow-up visits as told by your health care provider. This is important. Where to find more information  [employer] of Arthritis and Musculoskeletal and Skin Diseases: www.niams.SouthExposed.es  [employer] on Aging: http://kim-miller.com/  American College of Rheumatology: www.rheumatology.org Contact a health care provider if:  Your skin turns red.  You develop a rash.  You have pain that gets worse.  You have a fever along with joint or muscle aches. Get help right away if:  You lose a lot of weight.  You suddenly lose your appetite.  You have night sweats. Summary  Osteoarthritis is a type of arthritis that affects tissue covering the ends of bones in joints (cartilage).  This condition is caused by age-related wearing down of cartilage that covers the  ends of bones.  The main symptom of this condition is pain, swelling, and stiffness in the joint.  There is no cure for this condition, but treatment can help to control pain and improve joint function. This information is not intended to replace advice given to you by your health care provider. Make sure you discuss any questions you have with your health care provider. Document Revised: 12/15/2016 Document Reviewed: 09/06/2015 Elsevier Patient Education  2020 [employer].

## 2019-11-21 NOTE — Progress Notes (Signed)
Office Visit Note  Patient: [name]             Date of Birth: 03-12-1940           MRN: [id number]             PCP: Luetta Nutting, DO Referring: Luetta Nutting, DO Visit Date: 11/21/2019  Subjective:  New Patient (Initial Visit) (Abnormal labs, joint pain)   History of Present Illness: [name] is a 79 y.o. female with a history of hyperlipidemia, osteoporosis, and goiter here for evaluation of positive ANA checked in the context of right knee swelling and right leg burning pain.  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  She did notice some amount of swelling more so in the right knee that resolved intermittently.  Currently she is not noticing any swelling and has no pain at this time.  She does notice crepitus and movement in the knee not being very smooth.  Her active pain complaint today is bilateral burning type pain in the feet that bothers her especially at nighttime.  She denies any loss of sensation in this area and denies any weakness.  She has not noticed any overlying skin changes or swelling in her feet. She has a history of multinodular goiter but never required medication for thyroid dysfunction.  She denies other known history of autoimmune disease.  She does not have family history of vitamin disease but says her granddaughter has some type of innate immunodeficiency condition.  She has osteoporosis previously treated with oral bisphosphonates she has been off these for about 3 years did suffer a distal radial fracture of the right wrist since that time.  Labs reviewed 09/2019 ANA 1:80 nuclear, homogenous ESR 2  Imaging reviewed 09/2019 Xray right knee IMPRESSION: Small right knee effusion with no underlying acute fracture, dislocation, or severe degenerative changes of the right knee.  Activities of  Daily Living:  Patient reports morning stiffness for 0 none.   Patient Reports nocturnal pain.  Difficulty dressing/grooming: Denies Difficulty climbing stairs: Denies Difficulty getting out of chair: Reports Difficulty using hands for taps, buttons, cutlery, and/or writing: Reports  Review of Systems  Constitutional: Negative for fatigue.  HENT: Negative for mouth dryness.   Eyes: Positive for dryness.  Respiratory: Negative for shortness of breath.   Cardiovascular: Positive for swelling in legs/feet.  Gastrointestinal: Positive for constipation.  Endocrine: Negative for excessive thirst.  Genitourinary: Negative for difficulty urinating.  Musculoskeletal: Negative for arthralgias and joint pain.  Skin: Negative for rash.  Allergic/Immunologic: Negative for susceptible to infections.  Neurological: Negative for numbness.  Hematological: Positive for bruising/bleeding tendency.  Psychiatric/Behavioral: Positive for sleep disturbance.    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/12/2018   Ureteral stone 12/17/2013   Calculus of right kidney 08/18/2013   Left carotid bruit 02/10/2013   Osteoporosis 01/01/2013   New onset of headaches after age 14 10/18/2012   Mixed hyperlipidemia 08/05/2012   Carotid artery stenosis 05/26/2010   Low  back pain 03/09/2010   Nontoxic multinodular goiter 04/29/2008   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   DIVERTICULOSIS OF COLON 03/31/2004    Past Medical History:  Diagnosis Date   Allergy    Basal cell carcinoma    Cancer ([place])    basal cell    Cataract     beginning   Chronic kidney disease    kidney stones   Essential tremor     Family History  Problem Relation Age of Onset   Heart disease Mother    Diabetes Mother    Kidney disease Mother    Hypertension Mother    Heart disease Father 83   Heart attack Father    Stroke Father    Colon cancer Paternal Aunt    Heart disease Paternal Uncle 59        passed away 25    Heart disease Paternal Grandmother    Heart disease Paternal Grandfather    Heart disease Paternal Uncle    Heart disease Maternal Aunt    Heart disease Maternal Uncle    Brain cancer Maternal Aunt    Breast cancer Sister    Past Surgical History:  Procedure Laterality Date   APPENDECTOMY  1962   BASAL CELL CARCINOMA EXCISION  2003   BLEPHAROPLASTY  2015   upper/lower    [place]  2009   CHOLECYSTECTOMY  2003   COLONOSCOPY  2006   CYSTOSCOPY  2008   with stent   [place]   LITHOTRIPSY  2008   LITHOTRIPSY  2015   SIGMOIDOSCOPY  2010   [place]   Social History   Social History Narrative   Not on file   Immunization History  Administered Date(s) Administered   Fluad Quad(high Dose 65+) 10/15/2019   PFIZER SARS-COV-2 Vaccination 01/26/2019, 02/17/2019, 10/21/2019   Pneumococcal Conjugate-13 09/09/2014   Pneumococcal Polysaccharide-23 11/11/2009   Zoster 07/23/2008     Objective: Vital Signs: BP 135/78 (BP Location: Right Arm, Patient Position: Sitting, Cuff Size: Normal)    Pulse 76    Resp 15    Ht 5' 4.75" (1.645 m)    Wt 160 lb (72.6 kg)    BMI 26.83 kg/m    Physical Exam HENT:     Right Ear: External ear normal.     Left Ear: External ear normal.     Mouth/Throat:     Mouth: Mucous membranes are moist.     Pharynx: Oropharynx is clear.  Eyes:     Conjunctiva/sclera: Conjunctivae normal.  Cardiovascular:     Rate and Rhythm: Normal  rate and regular rhythm.  Pulmonary:     Effort: Pulmonary effort is normal.     Breath sounds: Normal breath sounds.  Skin:    General: Skin is warm and dry.     Findings: No rash.     Comments: Diffuse hair thinning without any bald areas  Neurological:     General: No focal deficit present.     Mental Status: She is alert.  Psychiatric:        Mood and Affect: Mood normal.     Musculoskeletal Exam:  Neck full range of motion no tenderness Shoulder, elbow, wrist, fingers full range of motion no tenderness or swelling, deviation of the right second DIP joint No paraspinal tenderness to palpation over upper and lower back Normal hip internal  and external rotation without pain, no tenderness to lateral hip palpation Bilateral knees with full range of motion and no effusions but bony enlargement and patellofemoral crepitus present, no tenderness or warmth or instability Ankles, MTPs full range of motion no tenderness or swelling  CDAI Exam: CDAI Score: -- Patient Global: --; Provider Global: -- Swollen: --; Tender: -- Joint Exam 11/21/2019   No joint exam has been documented for this visit   There is currently no information documented on the homunculus. Go to the Rheumatology activity and complete the homunculus joint exam.  Investigation: No additional findings.  Imaging: No results found.  Recent Labs: Lab Results  Component Value Date   WBC 6.4 07/29/2019   HGB 13.8 07/29/2019   PLT 215 07/29/2019   NA 141 07/29/2019   K 4.6 07/29/2019   CL 104 07/29/2019   CO2 29 07/29/2019   GLUCOSE 91 07/29/2019   BUN 18 07/29/2019   CREATININE 0.69 07/29/2019   BILITOT 1.4 (H) 07/29/2019   ALKPHOS 80 07/10/2007   AST 30 07/29/2019   ALT 24 07/29/2019   PROT 6.6 07/29/2019   ALBUMIN 3.7 07/10/2007   CALCIUM 10.1 07/29/2019   GFRAA 98 07/29/2019    Speciality Comments: No specialty comments available.  Procedures:  No procedures performed Allergies: Other    Assessment / Plan:     Visit Diagnoses: Swelling of right knee joint  She has bilateral knee osteoarthritis based on previous x-ray also present on physical exam and point-of-care ultrasound exam in clinic today.  Currently she has no symptomatic complaints.  I discussed recommendation to continue low impact exercise especially proximal leg strengthening for knee osteoarthritis.  I do not see any clear evidence for an inflammatory process as a cause of the current symptoms.  OA can develop intermittent effusions may benefit from soft knee sleeve or brace for support if symptoms return.  Positive ANA (antinuclear antibody)  She does not present clinical criteria of systemic lupus by history on exam at this time.  Previous labs reviewed do show positive ANA otherwise no concerning serologic findings.  I think the osteoarthritis of the knees is a likely explanation of the knee pain and intermittent swelling.   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  Inflammatory demyelinating polyneuropathy would usually present motor deficit as well and vasculitic neuropathy is more often asymmetric and also often has motor involvement.  She has low vitamin D but no other history of nutritional deficiencies has never had gastric surgery or follow a typical diet so no [place] reason to suspect metabolic cause of this.  Cause is unclear at this time but very low suspicion for an inflammatory process.  Orders: No orders of the defined types were placed in this encounter.  No orders of the defined types were placed in this encounter.   Follow-Up Instructions: Return if symptoms worsen or fail to improve.   Collier Salina, MD  Note - This record has been created using [employer].  Chart creation errors have been sought, but may not always  have been located. Such creation errors do not reflect on  the standard of medical care.

## 2020-02-01 ENCOUNTER — Other Ambulatory Visit: Payer: Self-pay | Admitting: Family Medicine

## 2020-02-21 ENCOUNTER — Other Ambulatory Visit: Payer: Self-pay | Admitting: Medical-Surgical

## 2020-03-30 ENCOUNTER — Encounter: Payer: Self-pay | Admitting: Family Medicine

## 2020-03-30 ENCOUNTER — Ambulatory Visit (INDEPENDENT_AMBULATORY_CARE_PROVIDER_SITE_OTHER): Payer: Medicare Other | Admitting: Family Medicine

## 2020-03-30 ENCOUNTER — Other Ambulatory Visit: Payer: Self-pay

## 2020-03-30 VITALS — BP 118/68 | HR 68 | Temp 97.7°F | Ht 65.0 in | Wt 165.3 lb

## 2020-03-30 DIAGNOSIS — M8000XS Age-related osteoporosis with current pathological fracture, unspecified site, sequela: Secondary | ICD-10-CM | POA: Diagnosis not present

## 2020-03-30 DIAGNOSIS — Z0000 Encounter for general adult medical examination without abnormal findings: Secondary | ICD-10-CM | POA: Diagnosis not present

## 2020-03-30 DIAGNOSIS — E782 Mixed hyperlipidemia: Secondary | ICD-10-CM

## 2020-03-30 NOTE — Patient Instructions (Signed)
Preventive Care 78 Years and Older, Female Preventive care refers to lifestyle choices and visits with your health care provider that can promote health and wellness. This includes:  A yearly physical exam. This is also called an annual wellness visit.  Regular dental and eye exams.  Immunizations.  Screening for certain conditions.  Healthy lifestyle choices, such as: ? Eating a healthy diet. ? Getting regular exercise. ? Not using drugs or products that contain nicotine and tobacco. ? Limiting alcohol use. What can I expect for my preventive care visit? Physical exam Your health care provider will check your:  Height and weight. These may be used to calculate your BMI (body mass index). BMI is a measurement that tells if you are at a healthy weight.  Heart rate and blood pressure.  Body temperature.  Skin for abnormal spots. Counseling Your health care provider may ask you questions about your:  Past medical problems.  Family's medical history.  Alcohol, tobacco, and drug use.  Emotional well-being.  Home life and relationship well-being.  Sexual activity.  Diet, exercise, and sleep habits.  History of falls.  Memory and ability to understand (cognition).  Work and work Statistician.  Pregnancy and menstrual history.  Access to firearms. What immunizations do I need? Vaccines are usually given at various ages, according to a schedule. Your health care provider will recommend vaccines for you based on your age, medical history, and lifestyle or other factors, such as travel or where you work.   What tests do I need? Blood tests  Lipid and cholesterol levels. These may be checked every 5 years, or more often depending on your overall health.  Hepatitis C test.  Hepatitis B test. Screening  Lung cancer screening. You may have this screening every year starting at age 78 if you have a 30-pack-year history of smoking and currently smoke or have quit within  the past 15 years.  Colorectal cancer screening. ? All adults should have this screening starting at age 78 and continuing until age 78. ? Your health care provider may recommend screening at age 78 if you are at increased risk. ? You will have tests every 1-10 years, depending on your results and the type of screening test.  Diabetes screening. ? This is done by checking your blood sugar (glucose) after you have not eaten for a while (fasting). ? You may have this done every 1-3 years.  Mammogram. ? This may be done every 1-2 years. ? Talk with your health care provider about how often you should have regular mammograms.  Abdominal aortic aneurysm (AAA) screening. You may need this if you are a current or former smoker.  BRCA-related cancer screening. This may be done if you have a family history of breast, ovarian, tubal, or peritoneal cancers. Other tests  STD (sexually transmitted disease) testing, if you are at risk.  Bone density scan. This is done to screen for osteoporosis. You may have this done starting at age 78. Talk with your health care provider about your test results, treatment options, and if necessary, the need for more tests. Follow these instructions at home: Eating and drinking  Eat a diet that includes fresh fruits and vegetables, whole grains, lean protein, and low-fat dairy products. Limit your intake of foods with high amounts of sugar, saturated fats, and salt.  Take vitamin and mineral supplements as recommended by your health care provider.  Do not drink alcohol if your health care provider tells you not to drink.  If you drink alcohol: ? Limit how much you have to 0-1 drink a day. ? Be aware of how much alcohol is in your drink. In the [place] one drink equals one 12 oz bottle of beer (355 mL), one 5 oz glass of wine (148 mL), or one 1 oz glass of hard liquor (44 mL).   Lifestyle  Take daily care of your teeth and gums. Brush your teeth every morning  and night with fluoride toothpaste. Floss one time each day.  Stay active. Exercise for at least 30 minutes 5 or more days each week.  Do not use any products that contain nicotine or tobacco, such as cigarettes, e-cigarettes, and chewing tobacco. If you need help quitting, ask your health care provider.  Do not use drugs.  If you are sexually active, practice safe sex. Use a condom or other form of protection in order to prevent STIs (sexually transmitted infections).  Talk with your health care provider about taking a low-dose aspirin or statin.  Find healthy ways to cope with stress, such as: ? Meditation, yoga, or listening to music. ? Journaling. ? Talking to a trusted person. ? Spending time with friends and family. Safety  Always wear your seat belt while driving or riding in a vehicle.  Do not drive: ? If you have been drinking alcohol. Do not ride with someone who has been drinking. ? When you are tired or distracted. ? While texting.  Wear a helmet and other protective equipment during sports activities.  If you have firearms in your house, make sure you follow all gun safety procedures. What's next?  Visit your health care provider once a year for an annual wellness visit.  Ask your health care provider how often you should have your eyes and teeth checked.  Stay up to date on all vaccines. This information is not intended to replace advice given to you by your health care provider. Make sure you discuss any questions you have with your health care provider. Document Revised: 12/24/2019 Document Reviewed: 12/27/2017 Elsevier Patient Education  2021 [employer].

## 2020-03-30 NOTE — Assessment & Plan Note (Signed)
Well adult. Orders Placed This Encounter  Procedures  . DG Bone Density    Standing Status:   Future    Standing Expiration Date:   03/30/2021    Order Specific Question:   Reason for Exam (SYMPTOM  OR DIAGNOSIS REQUIRED)    Answer:   osteoporosis    Order Specific Question:   Preferred imaging location?    Answer:   [facility]  . COMPLETE METABOLIC PANEL WITH GFR  . CBC  . Lipid Panel w/reflex Direct LDL  Screening: Update DEXA Immunizations: UTD Anticipatory guidance/Risk factor reduction:  Recommendations per AVS

## 2020-03-30 NOTE — Progress Notes (Signed)
[name] - 80 y.o. female MRN [id number]  Date of birth: 30-Oct-1940  Subjective Chief Complaint  Patient presents with   Annual Exam    HPI [name] is a 80 y.o. female here today for annual exam.  She has a history of depression and HLD.  She was also having some pain in her leg that has improved with vitamin d supplementation as well voltaren gel.  She is doing well with current medications for chronic conditions.   She is up to date on mammogram. Completed last month.   She is due for update DEXA, she does have history of osteoporosis and is taking vitamin d and calcium.    She sees dermatology for regular skin checks.    Review of Systems  Constitutional: Negative for chills, fever, malaise/fatigue and weight loss.  HENT: Negative for congestion, ear pain and sore throat.   Eyes: Negative for blurred vision, double vision and pain.  Respiratory: Negative for cough and shortness of breath.   Cardiovascular: Negative for chest pain and palpitations.  Gastrointestinal: Negative for abdominal pain, blood in stool, constipation, heartburn and nausea.  Genitourinary: Negative for dysuria and urgency.  Musculoskeletal: Negative for joint pain and myalgias.  Neurological: Negative for dizziness and headaches.  Endo/Heme/Allergies: Does not bruise/bleed easily.  Psychiatric/Behavioral: Negative for depression. The patient is not nervous/anxious and does not have insomnia.       Allergies  Allergen Reactions   Other Hives    dristan-over the counter cold prep that is off the shelf now per pt,     Past Medical History:  Diagnosis Date   Allergy    Basal cell carcinoma    Cancer ([place])    basal cell    Cataract    beginning   Chronic kidney disease    kidney stones   Essential tremor     Past Surgical History:  Procedure Laterality Date   APPENDECTOMY  1962   BASAL CELL CARCINOMA EXCISION  2003   BLEPHAROPLASTY  2015   upper/lower     [place]  2009   CHOLECYSTECTOMY  2003   COLONOSCOPY  2006   CYSTOSCOPY  2008   with stent   [place]   LITHOTRIPSY  2008   LITHOTRIPSY  2015   SIGMOIDOSCOPY  2010   TONSILLECTOMY  1951   TUBAL LIGATION  1976    Social History   Socioeconomic History   Marital status: Married    Spouse name: Not on file   Number of children: 2   Years of education: Not on file   Highest education level: Not on file  Occupational History   Occupation: Retired  Tobacco Use   Smoking status: Never Smoker   Smokeless tobacco: Never Used  Scientific laboratory technician Use: Never used  Substance and Sexual Activity   Alcohol use: Yes    Comment: 2 glasses of wine monthly   Drug use: No   Sexual activity: Not Currently    Partners: Male  Other Topics Concern   Not on file  Social History Narrative   Not on file   Social Determinants of Health   Financial Resource Strain: Not on file  Food Insecurity: Not on file  Transportation Needs: Not on file  Physical Activity: Not on file  Stress: Not on file  Social Connections: Not on file    Family History  Problem Relation Age  of Onset   Heart disease Mother    Diabetes Mother    Kidney disease Mother    Hypertension Mother    Heart disease Father 74   Heart attack Father    Stroke Father    Colon cancer Paternal Aunt    Heart disease Paternal Uncle 15        passed away 24    Heart disease Paternal Grandmother    Heart disease Paternal Grandfather    Heart disease Paternal Uncle    Heart disease Maternal Aunt    Heart disease Maternal Uncle    Brain cancer Maternal Aunt    Breast cancer Sister     Health Maintenance  Topic Date Due   Hepatitis C Screening  Never done   TETANUS/TDAP  Never done   DEXA SCAN  Never done   COVID-19 Vaccine (4 - Booster for [employer] series) 04/20/2020   INFLUENZA VACCINE   Completed   PNA vac Low Risk Adult  Completed   HPV VACCINES  Aged Out     ----------------------------------------------------------------------------------------------------------------------------------------------------------------------------------------------------------------- Physical Exam BP 118/68 (BP Location: Left Arm, Patient Position: Sitting, Cuff Size: Normal)    Pulse 68    Temp 97.7 F (36.5 C)    Ht 5\' 5"  (1.651 m)    Wt 165 lb 4.8 oz (75 kg)    SpO2 98%    BMI 27.51 kg/m   Physical Exam Constitutional:      General: She is not in acute distress. HENT:     Head: Normocephalic and atraumatic.     Right Ear: Tympanic membrane normal.     Left Ear: Tympanic membrane normal.     Nose: Nose normal.  Eyes:     General: No scleral icterus.    Conjunctiva/sclera: Conjunctivae normal.  Neck:     Thyroid: No thyromegaly.  Cardiovascular:     Rate and Rhythm: Normal rate and regular rhythm.     Heart sounds: Normal heart sounds.  Pulmonary:     Effort: Pulmonary effort is normal.     Breath sounds: Normal breath sounds.  Abdominal:     General: Bowel sounds are normal. There is no distension.     Palpations: Abdomen is soft.     Tenderness: There is no abdominal tenderness. There is no guarding.  Musculoskeletal:        General: Normal range of motion.     Cervical back: Normal range of motion and neck supple.  Lymphadenopathy:     Cervical: No cervical adenopathy.  Skin:    General: Skin is warm and dry.     Findings: No rash.  Neurological:     Mental Status: She is alert and oriented to person, place, and time.     Cranial Nerves: No cranial nerve deficit.     Coordination: Coordination normal.  Psychiatric:        Behavior: Behavior normal.      ------------------------------------------------------------------------------------------------------------------------------------------------------------------------------------------------------------------- Assessment and Plan  Well adult exam Well adult. Orders Placed This Encounter  Procedures   DG Bone Density    Standing Status:   Future    Standing Expiration Date:   03/30/2021    Order Specific Question:   Reason for Exam (SYMPTOM  OR DIAGNOSIS REQUIRED)    Answer:   osteoporosis    Order Specific Question:   Preferred imaging location?    Answer:   [facility]   COMPLETE METABOLIC PANEL WITH GFR   CBC   Lipid Panel w/reflex Direct LDL  Screening: Update DEXA Immunizations: UTD Anticipatory guidance/Risk factor reduction:  Recommendations per AVS   No orders of the defined types were placed in this encounter.   No follow-ups on file.    This visit occurred during the SARS-CoV-2 public health emergency.  Safety protocols were in place, including screening questions prior to the visit, additional usage of staff PPE, and extensive cleaning of exam room while observing appropriate contact time as indicated for disinfecting solutions.

## 2020-03-31 LAB — CBC
HCT: 44.6 % (ref 35.0–45.0)
Hemoglobin: 14.3 g/dL (ref 11.7–15.5)
MCH: 28.9 pg (ref 27.0–33.0)
MCHC: 32.1 g/dL (ref 32.0–36.0)
MCV: 90.1 fL (ref 80.0–100.0)
MPV: 10.7 fL (ref 7.5–12.5)
Platelets: 214 10*3/uL (ref 140–400)
RBC: 4.95 10*6/uL (ref 3.80–5.10)
RDW: 12.7 % (ref 11.0–15.0)
WBC: 6.5 10*3/uL (ref 3.8–10.8)

## 2020-03-31 LAB — LIPID PANEL W/REFLEX DIRECT LDL
Cholesterol: 171 mg/dL (ref <200)
HDL: 72 mg/dL (ref >=50)
LDL Cholesterol (Calc): 77 mg/dL (calc)
Non-HDL Cholesterol (Calc): 99 mg/dL (calc) (ref <130)
Total CHOL/HDL Ratio: 2.4 (calc) (ref <5.0)
Triglycerides: 135 mg/dL (ref <150)

## 2020-03-31 LAB — COMPLETE METABOLIC PANEL WITH GFR
AG Ratio: 2 (calc) (ref 1.0–2.5)
ALT: 13 U/L (ref 6–29)
AST: 21 U/L (ref 10–35)
Albumin: 4.3 g/dL (ref 3.6–5.1)
Alkaline phosphatase (APISO): 91 U/L (ref 37–153)
BUN: 17 mg/dL (ref 7–25)
CO2: 25 mmol/L (ref 20–32)
Calcium: 10.3 mg/dL (ref 8.6–10.4)
Chloride: 106 mmol/L (ref 98–110)
Creat: 0.65 mg/dL (ref 0.60–0.93)
GFR, Est African American: 99 mL/min/{1.73_m2} (ref >=60)
GFR, Est Non African American: 86 mL/min/{1.73_m2} (ref >=60)
Globulin: 2.2 g/dL (calc) (ref 1.9–3.7)
Glucose, Bld: 92 mg/dL (ref 65–139)
Potassium: 4.2 mmol/L (ref 3.5–5.3)
Sodium: 141 mmol/L (ref 135–146)
Total Bilirubin: 1.1 mg/dL (ref 0.2–1.2)
Total Protein: 6.5 g/dL (ref 6.1–8.1)

## 2020-04-13 ENCOUNTER — Ambulatory Visit (INDEPENDENT_AMBULATORY_CARE_PROVIDER_SITE_OTHER): Payer: Medicare Other | Admitting: Family Medicine

## 2020-04-13 ENCOUNTER — Other Ambulatory Visit: Payer: Self-pay

## 2020-04-13 VITALS — BP 133/83 | HR 91 | Resp 20 | Ht 65.0 in | Wt 165.0 lb

## 2020-04-13 DIAGNOSIS — I10 Essential (primary) hypertension: Secondary | ICD-10-CM

## 2020-04-13 NOTE — Progress Notes (Signed)
Medical screening examination/treatment was performed by qualified clinical staff member and as supervising physician I was immediately available for consultation/collaboration. I have reviewed documentation and agree with assessment and plan.  Mariyanna Mucha, DO  

## 2020-04-13 NOTE — Progress Notes (Signed)
Established Patient Office Visit  Subjective:  Patient ID: [name], female    DOB: 1940-05-29  Age: 80 y.o. MRN: [id number]  CC:  Chief Complaint  Patient presents with  . Patient Education    HPI DANIKAH BUDZIK presents for patient education. Pt states her at home BP readings are higher than the readings we get here in the clinic. Pt concerned her BP machine was not working correctly. Pt's BP today with clinic machine 133/75 and with her machine 133/83. Pt reports taking all prescribed medications. Pt states she thinks the higher readings at home may be due to stress. Pt states the delay in renovation at her house has been stressing her out. We discussed ways she could reduce stress, such as deep breathing or taking a walk. Pt verbalized understanding. Pt will continue to monitor BP and follow up if stress worsens.   Past Medical History:  Diagnosis Date  . Allergy   . Basal cell carcinoma   . Cancer ([place])    basal cell   . Cataract    beginning  . Chronic kidney disease    kidney stones  . Essential tremor     Past Surgical History:  Procedure Laterality Date  . APPENDECTOMY  1962  . BASAL CELL CARCINOMA EXCISION  2003  . BLEPHAROPLASTY  2015   upper/lower   . [place]  . CARDIAC CATHETERIZATION  2009  . CHOLECYSTECTOMY  2003  . COLONOSCOPY  2006  . CYSTOSCOPY  2008   with stent  . DILATION AND CURETTAGE OF UTERUS  1973  . LITHOTRIPSY  2008  . LITHOTRIPSY  2015  . SIGMOIDOSCOPY  2010  . TONSILLECTOMY  1951  . TUBAL LIGATION  1976    Family History  Problem Relation Age of Onset  . Heart disease Mother   . Diabetes Mother   . Kidney disease Mother   . Hypertension Mother   . Heart disease Father 26  . Heart attack Father   . Stroke Father   . Colon cancer Paternal Aunt   . Heart disease Paternal Uncle 12        passed away 12   . Heart disease Paternal Grandmother   . Heart disease Paternal Grandfather   . Heart  disease Paternal Uncle   . Heart disease Maternal Aunt   . Heart disease Maternal Uncle   . Brain cancer Maternal Aunt   . Breast cancer Sister     Social History   Socioeconomic History  . Marital status: Married    Spouse name: Not on file  . Number of children: 2  . Years of education: Not on file  . Highest education level: Not on file  Occupational History  . Occupation: Retired  Tobacco Use  . Smoking status: Never Smoker  . Smokeless tobacco: Never Used  Vaping Use  . Vaping Use: Never used  Substance and Sexual Activity  . Alcohol use: Yes    Comment: 2 glasses of wine monthly  . Drug use: No  . Sexual activity: Not Currently    Partners: Male  Other Topics Concern  . Not on file  Social History Narrative  . Not on file   Social Determinants of Health   Financial Resource Strain: Not on file  Food Insecurity: Not on file  Transportation Needs: Not on file  Physical Activity: Not on file  Stress: Not on file  Social Connections: Not on file  Intimate Partner Violence:  Not on file    Outpatient Medications Prior to Visit  Medication Sig Dispense Refill  . aspirin EC 81 MG tablet Take 81 mg by mouth.    . calcium-vitamin D (OSCAL) 250-125 MG-UNIT per tablet Take 1,200 mg by mouth.    . Cholecalciferol (VITAMIN D3) 250 MCG (10000 UT) capsule     . Cyanocobalamin (B-12) 1000 MCG CAPS Take 500 mcg by mouth.     Marland Kitchen FLUoxetine (PROZAC) 20 MG capsule TAKE 1 CAPSULE BY MOUTH EVERY DAY 90 capsule 0  . FLUoxetine (PROZAC) 40 MG capsule Take 1 capsule (40 mg total) by mouth daily. 90 capsule 0  . Magnesium Sulfate 70 MG CAPS Take 500 mg by mouth.    . simvastatin (ZOCOR) 40 MG tablet Take 1 tablet (40 mg total) by mouth at bedtime. 90 tablet 2   No facility-administered medications prior to visit.    Allergies  Allergen Reactions  . Other Hives    dristan-over the counter cold prep that is off the shelf now per pt,     ROS Review of Systems    Objective:     Physical Exam  BP 133/83 (BP Location: Left Arm, Patient Position: Sitting, Cuff Size: Normal) Comment: Pt supplied BP machine  Pulse 91   Resp 20   Ht 5\' 5"  (1.651 m)   Wt 165 lb (74.8 kg)   SpO2 98%   BMI 27.46 kg/m  Wt Readings from Last 3 Encounters:  04/13/20 165 lb (74.8 kg)  03/30/20 165 lb 4.8 oz (75 kg)  11/21/19 160 lb (72.6 kg)     Health Maintenance Due  Topic Date Due  . Hepatitis C Screening  Never done  . TETANUS/TDAP  Never done  . DEXA SCAN  Never done    There are no preventive care reminders to display for this patient.  Lab Results  Component Value Date   TSH 0.90 07/29/2019   Lab Results  Component Value Date   WBC 6.5 03/30/2020   HGB 14.3 03/30/2020   HCT 44.6 03/30/2020   MCV 90.1 03/30/2020   PLT 214 03/30/2020   Lab Results  Component Value Date   NA 141 03/30/2020   K 4.2 03/30/2020   CO2 25 03/30/2020   GLUCOSE 92 03/30/2020   BUN 17 03/30/2020   CREATININE 0.65 03/30/2020   BILITOT 1.1 03/30/2020   ALKPHOS 80 07/10/2007   AST 21 03/30/2020   ALT 13 03/30/2020   PROT 6.5 03/30/2020   ALBUMIN 3.7 07/10/2007   CALCIUM 10.3 03/30/2020   Lab Results  Component Value Date   CHOL 171 03/30/2020   Lab Results  Component Value Date   HDL 72 03/30/2020   Lab Results  Component Value Date   LDLCALC 77 03/30/2020   Lab Results  Component Value Date   TRIG 135 03/30/2020   Lab Results  Component Value Date   CHOLHDL 2.4 03/30/2020   No results found for: HGBA1C    Assessment & Plan:  Pt will continue to monitor BP and follow up if stress worsens.  Problem List Items Addressed This Visit   None     No orders of the defined types were placed in this encounter.   Follow-up: No follow-ups on file.    Ninfa Meeker, CMA

## 2020-04-21 ENCOUNTER — Ambulatory Visit (INDEPENDENT_AMBULATORY_CARE_PROVIDER_SITE_OTHER): Payer: Medicare Other

## 2020-04-21 ENCOUNTER — Other Ambulatory Visit: Payer: Self-pay

## 2020-04-21 DIAGNOSIS — M8000XS Age-related osteoporosis with current pathological fracture, unspecified site, sequela: Secondary | ICD-10-CM | POA: Diagnosis not present

## 2020-05-01 ENCOUNTER — Other Ambulatory Visit: Payer: Self-pay | Admitting: Family Medicine

## 2020-05-21 ENCOUNTER — Other Ambulatory Visit: Payer: Self-pay | Admitting: Family Medicine

## 2020-07-26 ENCOUNTER — Ambulatory Visit: Payer: Medicare Other | Admitting: Family Medicine

## 2020-07-29 ENCOUNTER — Other Ambulatory Visit: Payer: Self-pay | Admitting: Family Medicine

## 2020-07-30 ENCOUNTER — Other Ambulatory Visit: Payer: Self-pay | Admitting: Family Medicine

## 2020-08-16 ENCOUNTER — Other Ambulatory Visit: Payer: Self-pay | Admitting: Family Medicine

## 2020-08-24 ENCOUNTER — Encounter: Payer: Self-pay | Admitting: Family Medicine

## 2020-08-27 ENCOUNTER — Other Ambulatory Visit: Payer: Self-pay | Admitting: Family Medicine

## 2020-08-27 DIAGNOSIS — R54 Age-related physical debility: Secondary | ICD-10-CM

## 2020-09-02 NOTE — Progress Notes (Signed)
Acute Office Visit  Subjective:    Patient ID: [name], female    DOB: 06-07-1940, 80 y.o.   MRN: [id number]  Chief Complaint  Patient presents with   Constipation    HPI Patient is in today for abdominal pain and constipation.  Patient reports she has had some constipation for the past 3 weeks.  She has only had about 1 bowel movement a week for the past 3 weeks.  States she does not recall any recent diet changes or medication changes other than starting to take Advil nightly for a week or 2 due to her chronic leg pain.  She stopped taking it in case this was the cause of her constipation.  However she is still struggling.  Reports last week she took castor oil laxative which produced 1 good bowel movement.  On Sunday she took an over-the-counter stool softener and by Tuesday she had some runny stool but no more solid bowel movements.  She is reporting generalized abdominal discomfort described as fullness and bloating.  She is passing gas.  She reports some mild nausea from the fullness, however she has not had any vomiting.  She reports the discomfort across her abdomen is not radiating and not particularly worse on one side or the other.  She has trouble getting in a comfortable position to sleep because of the fullness.  States the pain can be as bad as 6 out of 10 discomfort.  Nothing has made it better apart from having 1 bowel movement last week.  She states this is never happened before.  Reports she tries eat healthy diet because her husband has CHF-fruits, vegetables, fish, meats and minimal dairy.  She denies any recent stressors.  Patient denies any vomiting, appetite changes, diarrhea, melena, hematemesis, heartburn, fever, chills, dysuria, hematuria, weight changes.  Minimal alcohol intake - maybe one glass of wine per week.      Past Medical History:  Diagnosis Date   Allergy    Basal cell carcinoma    Cancer ([place])    basal cell    Cataract    beginning    Chronic kidney disease    kidney stones   Essential tremor     Past Surgical History:  Procedure Laterality Date   APPENDECTOMY  1962   BASAL CELL CARCINOMA EXCISION  2003   BLEPHAROPLASTY  2015   upper/lower    [place]   CARDIAC CATHETERIZATION  2009   CHOLECYSTECTOMY  2003   COLONOSCOPY  2006   CYSTOSCOPY  2008   with stent   [place]   LITHOTRIPSY  2008   LITHOTRIPSY  2015   SIGMOIDOSCOPY  2010   TONSILLECTOMY  1951   TUBAL LIGATION  1976    Family History  Problem Relation Age of Onset   Heart disease Mother    Diabetes Mother    Kidney disease Mother    Hypertension Mother    Heart disease Father 74   Heart attack Father    Stroke Father    Colon cancer Paternal Aunt    Heart disease Paternal Uncle 66        passed away 64    Heart disease Paternal Grandmother    Heart disease Paternal Grandfather    Heart disease Paternal Uncle    Heart disease Maternal Aunt    Heart disease Maternal Uncle    Brain cancer Maternal Aunt    Breast cancer Sister  Social History   Socioeconomic History   Marital status: Married    Spouse name: Not on file   Number of children: 2   Years of education: Not on file   Highest education level: Not on file  Occupational History   Occupation: Retired  Tobacco Use   Smoking status: Never   Smokeless tobacco: Never  Vaping Use   Vaping Use: Never used  Substance and Sexual Activity   Alcohol use: Yes    Comment: 2 glasses of wine monthly   Drug use: No   Sexual activity: Not Currently    Partners: Male  Other Topics Concern   Not on file  Social History Narrative   Not on file   Social Determinants of Health   Financial Resource Strain: Not on file  Food Insecurity: Not on file  Transportation Needs: Not on file  Physical Activity: Not on file  Stress: Not on file  Social Connections: Not on file  Intimate Partner Violence: Not on file    Outpatient  Medications Prior to Visit  Medication Sig Dispense Refill   aspirin EC 81 MG tablet Take 81 mg by mouth.     calcium-vitamin D (OSCAL) 250-125 MG-UNIT per tablet Take 1,200 mg by mouth.     Cholecalciferol (VITAMIN D3) 250 MCG (10000 UT) capsule      Cyanocobalamin (B-12) 1000 MCG CAPS Take 500 mcg by mouth.      FLUoxetine (PROZAC) 20 MG capsule TAKE 1 CAPSULE BY MOUTH EVERY DAY 90 capsule 0   FLUoxetine (PROZAC) 40 MG capsule TAKE 1 CAPSULE (40 MG TOTAL) BY MOUTH DAILY. 90 capsule 0   Magnesium Sulfate 70 MG CAPS Take 500 mg by mouth.     simvastatin (ZOCOR) 40 MG tablet TAKE 1 TABLET BY MOUTH EVERYDAY AT BEDTIME 90 tablet 2   No facility-administered medications prior to visit.    Allergies  Allergen Reactions   Other Hives    dristan-over the counter cold prep that is off the shelf now per pt,     Review of Systems All review of systems negative except what is listed in the HPI     Objective:    Physical Exam Constitutional:      Appearance: Normal appearance.  Cardiovascular:     Rate and Rhythm: Regular rhythm.     Heart sounds: Normal heart sounds.  Pulmonary:     Breath sounds: Normal breath sounds.  Abdominal:     General: Bowel sounds are normal. There is no distension.     Tenderness: There is abdominal tenderness. There is no guarding or rebound.     Comments: Fullness to LLQ - palpable stool; mild abdominal tenderness  Neurological:     General: No focal deficit present.     Mental Status: She is alert and oriented to person, place, and time. Mental status is at baseline.  Psychiatric:        Mood and Affect: Mood normal.        Behavior: Behavior normal.        Thought Content: Thought content normal.      BP (!) 148/80   Pulse 84   Temp 98 F (36.7 C)   Resp 17   Wt 161 lb 9.6 oz (73.3 kg)   SpO2 97%   BMI 26.89 kg/m  Wt Readings from Last 3 Encounters:  09/03/20 161 lb 9.6 oz (73.3 kg)  04/13/20 165 lb (74.8 kg)  03/30/20 165 lb 4.8 oz  (75 kg)  Health Maintenance Due  Topic Date Due   Hepatitis C Screening  Never done   TETANUS/TDAP  Never done   Zoster Vaccines- Shingrix (1 of 2) Never done   COVID-19 Vaccine (4 - Booster for [employer] series) 01/21/2020   INFLUENZA VACCINE  08/16/2020    There are no preventive care reminders to display for this patient.   Lab Results  Component Value Date   TSH 0.90 07/29/2019   Lab Results  Component Value Date   WBC 6.5 03/30/2020   HGB 14.3 03/30/2020   HCT 44.6 03/30/2020   MCV 90.1 03/30/2020   PLT 214 03/30/2020   Lab Results  Component Value Date   NA 141 03/30/2020   K 4.2 03/30/2020   CO2 25 03/30/2020   GLUCOSE 92 03/30/2020   BUN 17 03/30/2020   CREATININE 0.65 03/30/2020   BILITOT 1.1 03/30/2020   ALKPHOS 80 07/10/2007   AST 21 03/30/2020   ALT 13 03/30/2020   PROT 6.5 03/30/2020   ALBUMIN 3.7 07/10/2007   CALCIUM 10.3 03/30/2020   Lab Results  Component Value Date   CHOL 171 03/30/2020   Lab Results  Component Value Date   HDL 72 03/30/2020   Lab Results  Component Value Date   LDLCALC 77 03/30/2020   Lab Results  Component Value Date   TRIG 135 03/30/2020   Lab Results  Component Value Date   CHOLHDL 2.4 03/30/2020   No results found for: HGBA1C     Assessment & Plan:   1. Constipation, unspecified constipation type 2. Abdominal pain, generalized  Given the duration, nausea, and level of discomfort, will go ahead and get KUB to ensure no obstruction. Patient given instructions on bowel regimen options- recommending 3-5 days of miralax + senna until good BM. Also focusing on daily hydration, activity, fiber-intake, Metamucil, Benefiber, or other bulk-forming agent regularly. Discussed potential for needing an OTC enema if miralax + senna not successful. Patient agreeable to plan. Instructed to return if not improving by Monday. Patient aware of signs/symptoms requiring further/urgent evaluation.  Follow-up as needed, if  symptoms worsen or fail to improve.   Purcell Nails Olevia Bowens, DNP, FNP-C   - DG Abd 1 View

## 2020-09-03 ENCOUNTER — Other Ambulatory Visit: Payer: Self-pay

## 2020-09-03 ENCOUNTER — Encounter: Payer: Self-pay | Admitting: Family Medicine

## 2020-09-03 ENCOUNTER — Ambulatory Visit (INDEPENDENT_AMBULATORY_CARE_PROVIDER_SITE_OTHER): Payer: Medicare Other | Admitting: Family Medicine

## 2020-09-03 ENCOUNTER — Ambulatory Visit (INDEPENDENT_AMBULATORY_CARE_PROVIDER_SITE_OTHER): Payer: Medicare Other

## 2020-09-03 VITALS — BP 148/80 | HR 84 | Temp 98.0°F | Resp 17 | Wt 161.6 lb

## 2020-09-03 DIAGNOSIS — N200 Calculus of kidney: Secondary | ICD-10-CM | POA: Diagnosis not present

## 2020-09-03 DIAGNOSIS — R1084 Generalized abdominal pain: Secondary | ICD-10-CM | POA: Diagnosis not present

## 2020-09-03 DIAGNOSIS — K5900 Constipation, unspecified: Secondary | ICD-10-CM | POA: Diagnosis not present

## 2020-09-03 NOTE — Patient Instructions (Signed)
CONSTIPATION:  ALL THE TIME:  Lifestyle measures Hydration: drink plenty of water Physical activity: at least walking daily High-fiber foods Bulk forming laxatives  Psyllium (Konsyl; Metamucil; Perdiem) Methylcellulose (Citrucel) Calcium polycarbophil (FiberCon; Fiber-Lax; Mitrolan) Wheat dextrin (Benefiber)  AS NEEDED FOR 3-5 DAYS AT A TIME OR ALL THE TIME 1-2 TIMES PER WEEK  (CAN TAKE ONE FROM EACH CATEGORY E.G. MIRALAX + SENNA) Hyperosmolar or saline laxatives: Polyethylene glycol (MiraLAX, GlycoLax) Lactulose Sorbitol Magnesium hydroxide (Milk of Magnesia)  Magnesium citrate (Evac-Q-Mag)  Stimulant laxatives Senna (eg, Black Draught, Ex-Lax, Fletcher's, Castoria, Senokot)  Bisacodyl (eg, Correctol, Doxidan, Dulcolax). Taking stimulant laxatives regularly or in large amounts can cause side effects, including low potassium levels. Thus, you should take these drugs carefully if you must use them regularly.  PRESCRIPTIONS that treat severe constipation. They are expensive, but may be recommended if you do not respond to other treatments. Lubiprostone (Amitiza) Linaclotide (Linzess) Plecanatide (Trulance, Motegrity) Tenapanor [name])

## 2020-09-06 MED ORDER — TAMSULOSIN HCL 0.4 MG PO CAPS
0.4000 mg | ORAL_CAPSULE | Freq: Every day | ORAL | 0 refills | Status: DC
Start: 2020-09-06 — End: 2021-07-01

## 2020-09-06 NOTE — Addendum Note (Signed)
Addended by: Terrilyn Saver on: 09/06/2020 07:54 AM   Modules accepted: Orders

## 2020-09-06 NOTE — Progress Notes (Signed)
MyChart message sent: The x-ray showed a small to moderate amount of stool in your colon. It also suggested you have kidney stones. The x-ray is not the most detailed way to diagnose kidney stones (a CT scan is more accurate), but we can go ahead and try treating with a medication that will help you pass them. We can always get a CT scan to get a better picture of the stones, but the treatment would be the same for now, unless the CT shows that they are much bigger than the x-ray was suspecting. I am sending in some Flomax (tamsulosin) to help the stones pass, as well as encouraging you to stay well hydrated and use ibuprofen and tylenol for pain management. If your pain becomes more severe, please let us know.

## 2020-11-04 ENCOUNTER — Other Ambulatory Visit: Payer: Self-pay | Admitting: Family Medicine

## 2020-11-16 ENCOUNTER — Other Ambulatory Visit: Payer: Self-pay | Admitting: Family Medicine

## 2020-11-26 ENCOUNTER — Ambulatory Visit (INDEPENDENT_AMBULATORY_CARE_PROVIDER_SITE_OTHER): Payer: Medicare Other | Admitting: Family Medicine

## 2020-11-26 ENCOUNTER — Other Ambulatory Visit: Payer: Self-pay

## 2020-11-26 DIAGNOSIS — Z23 Encounter for immunization: Secondary | ICD-10-CM

## 2021-02-01 ENCOUNTER — Other Ambulatory Visit: Payer: Self-pay | Admitting: Family Medicine

## 2021-02-07 ENCOUNTER — Other Ambulatory Visit: Payer: Self-pay | Admitting: Family Medicine

## 2021-02-22 ENCOUNTER — Other Ambulatory Visit: Payer: Self-pay | Admitting: Family Medicine

## 2021-02-28 ENCOUNTER — Ambulatory Visit: Payer: Medicare Other | Admitting: Family Medicine

## 2021-03-03 ENCOUNTER — Ambulatory Visit (INDEPENDENT_AMBULATORY_CARE_PROVIDER_SITE_OTHER): Payer: Medicare Other | Admitting: Family Medicine

## 2021-03-03 ENCOUNTER — Encounter: Payer: Self-pay | Admitting: Family Medicine

## 2021-03-03 ENCOUNTER — Other Ambulatory Visit: Payer: Self-pay

## 2021-03-03 VITALS — BP 139/69 | HR 81 | Temp 97.1°F | Ht 65.0 in | Wt 164.0 lb

## 2021-03-03 DIAGNOSIS — M25461 Effusion, right knee: Secondary | ICD-10-CM | POA: Diagnosis not present

## 2021-03-03 DIAGNOSIS — F325 Major depressive disorder, single episode, in full remission: Secondary | ICD-10-CM | POA: Diagnosis not present

## 2021-03-03 DIAGNOSIS — E782 Mixed hyperlipidemia: Secondary | ICD-10-CM

## 2021-03-03 MED ORDER — FLUOXETINE HCL 20 MG PO CAPS: ORAL_CAPSULE | ORAL | 2 refills | Status: DC

## 2021-03-03 MED ORDER — FLUOXETINE HCL 40 MG PO CAPS: 40.0000 mg | ORAL_CAPSULE | Freq: Every day | ORAL | 3 refills | Status: DC

## 2021-03-03 NOTE — Assessment & Plan Note (Signed)
She continues to do well with fluoxetine at current strength.  We will continue at 40 mg daily.

## 2021-03-03 NOTE — Progress Notes (Signed)
[name] - 81 y.o. female MRN [id number]  Date of birth: July 13, 1940  Subjective Chief Complaint  Patient presents with   Medication Refill    HPI [name] is a 81 year old female here today for follow-up visit.  Reports that overall she is doing well.  She needs refill of fluoxetine.  She is doing well at current strength.    Knee pain is well managed with Voltaren gel and Tylenol as needed.  She is due for updated blood work today.  ROS:  A comprehensive ROS was completed and negative except as noted per HPI   Allergies  Allergen Reactions   Other Hives    dristan-over the counter cold prep that is off the shelf now per pt,     Past Medical History:  Diagnosis Date   Allergy    Basal cell carcinoma    Cancer ([employer])    basal cell    Cataract    beginning   Chronic kidney disease    kidney stones   Essential tremor     Past Surgical History:  Procedure Laterality Date   APPENDECTOMY  1962   BASAL CELL CARCINOMA EXCISION  2003   BLEPHAROPLASTY  2015   upper/lower    [place]  2009   CHOLECYSTECTOMY  2003   COLONOSCOPY  2006   CYSTOSCOPY  2008   with stent   [place]   LITHOTRIPSY  2008   LITHOTRIPSY  2015   SIGMOIDOSCOPY  2010   [place]    Social History   Socioeconomic History   Marital status: Married    Spouse name: Not on file   Number of children: 2   Years of education: Not on file   Highest education level: Not on file  Occupational History   Occupation: Retired  Tobacco Use   Smoking status: Never   Smokeless tobacco: Never  Vaping Use   Vaping Use: Never used  Substance and Sexual Activity   Alcohol use: Yes    Comment: 2 glasses of wine monthly   Drug use: No   Sexual activity: Not Currently    Partners: Male  Other Topics Concern   Not on file  Social History Narrative   Not on file   Social  Determinants of Health   Financial Resource Strain: Not on file  Food Insecurity: Not on file  Transportation Needs: Not on file  Physical Activity: Not on file  Stress: Not on file  Social Connections: Not on file    Family History  Problem Relation Age of Onset   Heart disease Mother    Diabetes Mother    Kidney disease Mother    Hypertension Mother    Heart disease Father 99   Heart attack Father    Stroke Father    Colon cancer Paternal Aunt    Heart disease Paternal Uncle 67        passed away 20    Heart disease Paternal Grandmother    Heart disease Paternal Grandfather    Heart disease Paternal Uncle    Heart disease Maternal Aunt    Heart disease Maternal Uncle    Brain cancer Maternal Aunt    Breast cancer Sister     Health Maintenance  Topic Date Due   Hepatitis C Screening  Never done   TETANUS/TDAP  Never done   Zoster Vaccines-  Shingrix (1 of 2) Never done   COVID-19 Vaccine (4 - Booster for Pfizer series) 12/16/2019   Pneumonia Vaccine 49+ Years old  Completed   INFLUENZA VACCINE  Completed   DEXA SCAN  Completed   HPV VACCINES  Aged Out     ----------------------------------------------------------------------------------------------------------------------------------------------------------------------------------------------------------------- Physical Exam BP 139/69 (BP Location: Left Arm, Patient Position: Sitting, Cuff Size: Normal)    Pulse 81    Temp (!) 97.1 F (36.2 C)    Ht 5\' 5"  (1.651 m)    Wt 164 lb (74.4 kg)    SpO2 96%    BMI 27.29 kg/m   Physical Exam Constitutional:      Appearance: Normal appearance.  Eyes:     General: No scleral icterus. Cardiovascular:     Rate and Rhythm: Normal rate and regular rhythm.  Pulmonary:     Effort: Pulmonary effort is normal.     Breath sounds: Normal breath sounds.  Musculoskeletal:     Cervical back: Neck supple.  Neurological:     Mental Status: She is alert.  Psychiatric:         Mood and Affect: Mood normal.        Behavior: Behavior normal.    ------------------------------------------------------------------------------------------------------------------------------------------------------------------------------------------------------------------- Assessment and Plan  Swelling of right knee joint Knee pain remains fairly well controlled with Voltaren gel and Tylenol as needed.  Mixed hyperlipidemia Tolerating simvastatin well at current strength.  Updating lipid panel.  Major depressive disorder with single episode, in remission [facility]) She continues to do well with fluoxetine at current strength.  We will continue at 40 mg daily.   Meds ordered this encounter  Medications   DISCONTD: FLUoxetine (PROZAC) 20 MG capsule    Sig: TAKE 1 CAPSULE BY MOUTH EVERY DAY    Dispense:  90 capsule    Refill:  2   FLUoxetine (PROZAC) 40 MG capsule    Sig: Take 1 capsule (40 mg total) by mouth daily.    Dispense:  90 capsule    Refill:  3    Return in about 6 months (around 08/31/2021) for MDD/HLD.    This visit occurred during the SARS-CoV-2 public health emergency.  Safety protocols were in place, including screening questions prior to the visit, additional usage of staff PPE, and extensive cleaning of exam room while observing appropriate contact time as indicated for disinfecting solutions.

## 2021-03-03 NOTE — Assessment & Plan Note (Signed)
Knee pain remains fairly well controlled with Voltaren gel and Tylenol as needed.

## 2021-03-03 NOTE — Assessment & Plan Note (Signed)
Tolerating simvastatin well at current strength.  Updating lipid panel.

## 2021-03-03 NOTE — Patient Instructions (Signed)
Great to see you today! Have labs completed when fasting.

## 2021-03-25 LAB — CBC WITH DIFFERENTIAL/PLATELET
Absolute Monocytes: 524 cells/uL (ref 200–950)
Basophils Absolute: 29 cells/uL (ref 0–200)
Basophils Relative: 0.6 %
Eosinophils Absolute: 88 cells/uL (ref 15–500)
Eosinophils Relative: 1.8 %
HCT: 42.4 % (ref 35.0–45.0)
Hemoglobin: 13.9 g/dL (ref 11.7–15.5)
Lymphs Abs: 1989 cells/uL (ref 850–3900)
MCH: 29.4 pg (ref 27.0–33.0)
MCHC: 32.8 g/dL (ref 32.0–36.0)
MCV: 89.6 fL (ref 80.0–100.0)
MPV: 10.5 fL (ref 7.5–12.5)
Monocytes Relative: 10.7 %
Neutro Abs: 2269 cells/uL (ref 1500–7800)
Neutrophils Relative %: 46.3 %
Platelets: 219 10*3/uL (ref 140–400)
RBC: 4.73 10*6/uL (ref 3.80–5.10)
RDW: 12.6 % (ref 11.0–15.0)
Total Lymphocyte: 40.6 %
WBC: 4.9 10*3/uL (ref 3.8–10.8)

## 2021-03-25 LAB — COMPLETE METABOLIC PANEL WITH GFR
AG Ratio: 1.7 (calc) (ref 1.0–2.5)
ALT: 17 U/L (ref 6–29)
AST: 24 U/L (ref 10–35)
Albumin: 4 g/dL (ref 3.6–5.1)
Alkaline phosphatase (APISO): 77 U/L (ref 37–153)
BUN: 13 mg/dL (ref 7–25)
CO2: 27 mmol/L (ref 20–32)
Calcium: 9.5 mg/dL (ref 8.6–10.4)
Chloride: 107 mmol/L (ref 98–110)
Creat: 0.75 mg/dL (ref 0.60–1.00)
Globulin: 2.4 g/dL (calc) (ref 1.9–3.7)
Glucose, Bld: 84 mg/dL (ref 65–99)
Potassium: 4.4 mmol/L (ref 3.5–5.3)
Sodium: 142 mmol/L (ref 135–146)
Total Bilirubin: 1.1 mg/dL (ref 0.2–1.2)
Total Protein: 6.4 g/dL (ref 6.1–8.1)
eGFR: 81 mL/min/{1.73_m2} (ref >=60)

## 2021-03-25 LAB — LIPID PANEL W/REFLEX DIRECT LDL
Cholesterol: 159 mg/dL (ref <200)
HDL: 71 mg/dL (ref >=50)
LDL Cholesterol (Calc): 70 mg/dL (calc)
Non-HDL Cholesterol (Calc): 88 mg/dL (calc) (ref <130)
Total CHOL/HDL Ratio: 2.2 (calc) (ref <5.0)
Triglycerides: 93 mg/dL (ref <150)

## 2021-04-04 ENCOUNTER — Encounter: Payer: Self-pay | Admitting: Family Medicine

## 2021-04-05 ENCOUNTER — Other Ambulatory Visit: Payer: Self-pay | Admitting: Family Medicine

## 2021-04-05 DIAGNOSIS — G629 Polyneuropathy, unspecified: Secondary | ICD-10-CM

## 2021-04-25 ENCOUNTER — Other Ambulatory Visit: Payer: Self-pay

## 2021-04-25 ENCOUNTER — Encounter: Payer: Self-pay | Admitting: Neurology

## 2021-04-25 DIAGNOSIS — R202 Paresthesia of skin: Secondary | ICD-10-CM

## 2021-05-05 ENCOUNTER — Other Ambulatory Visit: Payer: Self-pay | Admitting: Family Medicine

## 2021-06-30 ENCOUNTER — Encounter: Payer: Self-pay | Admitting: Family Medicine

## 2021-07-01 ENCOUNTER — Ambulatory Visit (INDEPENDENT_AMBULATORY_CARE_PROVIDER_SITE_OTHER): Payer: Medicare Other | Admitting: Family Medicine

## 2021-07-01 DIAGNOSIS — R030 Elevated blood-pressure reading, without diagnosis of hypertension: Secondary | ICD-10-CM

## 2021-07-01 NOTE — Progress Notes (Unsigned)
Pt presents to clinic today for BP check. Denies any sob/palpitaions/headaches/dizziness or swelling.    She did state that she had a few times her chest didn't "feel" right, and that she woke up with her L arm feeling numb with a dull pain. She hasn't had any issues since. She has felt like she doesn't have any energy. She stated that she has a FHX of heart problems.  I asked if there have been any changes recently any stressors she stated that she could not think of anything but then she said that her legs have been bothering her for sometime and that she has an appointment with Neurology 8/31.  Pt did check her bp this am it was 135/75. She said that when she did check her bp when she was experiencing a feeling of her bp being elevated it was 147/76.   Spoke to Dr. Zigmund Daniel and he advised that she RTC to see him in 2-3 weeks to discuss this further.   Pt stated that she has an appointment with him already scheduled (8/16). If she has any concerns/issues she will call to schedule an appointment.

## 2021-07-03 NOTE — Progress Notes (Signed)
Medical screening examination/treatment was performed by qualified clinical staff member and as supervising physician I was immediately available for consultation/collaboration. I have reviewed documentation and agree with assessment and plan.  Deretha Ertle, DO  

## 2021-07-20 DIAGNOSIS — Z85828 Personal history of other malignant neoplasm of skin: Secondary | ICD-10-CM | POA: Insufficient documentation

## 2021-08-04 ENCOUNTER — Other Ambulatory Visit: Payer: Self-pay | Admitting: Family Medicine

## 2021-08-18 ENCOUNTER — Encounter: Payer: Medicare Other | Admitting: Neurology

## 2021-08-18 LAB — HM MAMMOGRAPHY

## 2021-08-23 ENCOUNTER — Ambulatory Visit: Payer: Medicare Other | Admitting: Neurology

## 2021-08-23 DIAGNOSIS — R202 Paresthesia of skin: Secondary | ICD-10-CM

## 2021-08-23 DIAGNOSIS — M5417 Radiculopathy, lumbosacral region: Secondary | ICD-10-CM

## 2021-08-23 NOTE — Procedures (Signed)
[facility]  [address]  [place], [place] [address] Tel: [phone] Fax:  [phone] Test Date:  08/23/2021  Patient: Sacoya Mcgourty DOB: 07/11/1940 Physician: Narda Amber, DO  Sex: Female Height: '5\' 5"'$  Ref Phys: Luetta Nutting, DO  ID#: [id number]   Technician:    Patient Complaints: This is a 81 year old female referred for evaluation of bilateral leg paresthesias and pain.  NCV & EMG Findings: Extensive electrodiagnostic testing of the right lower extremity and additional studies of the left shows:  Bilateral sural and superficial peroneal sensory responses are within normal limits. Left peroneal motor response shows reduced amplitude (1.8 mV) at the extensor digitorum brevis, and is normal at the tibialis anterior.  Right peroneal and bilateral tibial motor responses are within normal limits. Bilateral tibial H reflex studies are within normal limits. Chronic motor axonal loss changes are seen affecting the S1 myotomes denervation.  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 There is no evidence of a large fiber sensorimotor lower extremities.   ___________________________ Narda Amber, DO    Nerve Conduction Studies Anti Sensory Summary Table   Stim Site NR Peak (ms) Norm Peak (ms) P-T Amp (V) Norm P-T Amp  Left Sup Peroneal Anti Sensory (Ant Lat Mall)  32C  12 cm    2.3 <4.6 8.6 >3  Right Sup Peroneal Anti Sensory (Ant Lat Mall)  32C  12 cm    3.0 <4.6 5.4 >3  Left Sural Anti Sensory (Lat Mall)  32C  Calf    3.5 <4.6 7.2 >3  Right Sural Anti Sensory (Lat Mall)  32C  Calf    3.2 <4.6 6.7 >3   Motor Summary Table   Stim Site NR Onset (ms) Norm Onset (ms) O-P Amp (mV) Norm O-P Amp Site1 Site2 Delta-0 (ms) Dist (cm) Vel (m/s) Norm Vel (m/s)  Left Peroneal Motor (Ext Dig Brev)  32C  Ankle    4.4 <6.0 1.8 >2.5 B Fib Ankle 7.7 35.0 45 >40  B Fib    12.1  1.6  Poplt B Fib 1.7 8.0 47 >40  Poplt     13.8  1.6         Right Peroneal Motor (Ext Dig Brev)  32C  Ankle    3.8 <6.0 2.7 >2.5 B Fib Ankle 7.8 37.0 47 >40  B Fib    11.6  2.6  Poplt B Fib 1.6 8.0 50 >40  Poplt    13.2  2.6         Left Peroneal TA Motor (Tib Ant)  32C  Fib Head    3.4 <4.5 3.7 >3 Poplit Fib Head 1.3 8.0 62 >40  Poplit    4.7  3.7         Left Tibial Motor (Abd Hall Brev)  32C  Ankle    4.8 <6.0 8.8 >4 Knee Ankle 9.2 42.0 46 >40  Knee    14.0  7.3         Right Tibial Motor (Abd Hall Brev)  32C  Ankle    3.8 <6.0 6.4 >4 Knee Ankle 9.6 40.0 42 >40  Knee    13.4  4.9          H Reflex Studies   NR H-Lat (ms) Lat Norm (ms) L-R H-Lat (ms)  Left Tibial (Gastroc)  32C     32.79 <35 0.00  Right Tibial (Gastroc)  32C     32.79 <35 0.00   EMG  Side Muscle Ins Act Fibs Psw Fasc Number Recrt Dur Dur. Amp Amp. Poly Poly. Comment  Right AntTibialis Nml Nml Nml Nml Nml Nml Nml Nml Nml Nml Nml Nml N/A  Right Gastroc Nml Nml Nml Nml 1- Rapid Some 1+ Some 1+ Some 1+ N/A  Right Flex Dig Long Nml Nml Nml Nml Nml Nml Nml Nml Nml Nml Nml Nml N/A  Right RectFemoris Nml Nml Nml Nml Nml Nml Nml Nml Nml Nml Nml Nml N/A  Right GluteusMed Nml Nml Nml Nml Nml Nml Nml Nml Nml Nml Nml Nml N/A  Right BicepsFemS Nml Nml Nml Nml 1- Rapid Some 1+ Some 1+ Some 1+ N/A  Left BicepsFemS Nml Nml Nml Nml 1- Rapid Some 1+ Some 1+ Some 1+ N/A  Left AntTibialis Nml Nml Nml Nml Nml Nml Nml Nml Nml Nml Nml Nml N/A  Left Gastroc Nml Nml Nml Nml 1- Rapid Some 1+ Some 1+ Some 1+ N/A  Left Flex Dig Long Nml Nml Nml Nml Nml Nml Nml Nml Nml Nml Nml Nml N/A  Left RectFemoris Nml Nml Nml Nml Nml Nml Nml Nml Nml Nml Nml Nml N/A  Left GluteusMed Nml Nml Nml Nml Nml Nml Nml Nml Nml Nml Nml Nml N/A      Waveforms:

## 2021-08-31 ENCOUNTER — Ambulatory Visit (INDEPENDENT_AMBULATORY_CARE_PROVIDER_SITE_OTHER): Payer: Medicare Other | Admitting: Family Medicine

## 2021-08-31 ENCOUNTER — Encounter: Payer: Self-pay | Admitting: Family Medicine

## 2021-08-31 DIAGNOSIS — G8929 Other chronic pain: Secondary | ICD-10-CM | POA: Diagnosis not present

## 2021-08-31 DIAGNOSIS — M545 Low back pain, unspecified: Secondary | ICD-10-CM | POA: Diagnosis not present

## 2021-08-31 NOTE — Assessment & Plan Note (Addendum)
Low back pain with radicular features.  Recent nerve conduction with S1 radiculopathy.  She has had improvement of her pain since stopping her statin.  She would like to hold off on MRI at this time for further evaluation.  She will let me know if symptoms worsen.

## 2021-08-31 NOTE — Patient Instructions (Signed)
Let me know if your pain worsens.  Let's plan to recheck your cholesterol in 3-4 months.

## 2021-08-31 NOTE — Progress Notes (Signed)
[name] - 81 y.o. female MRN [id number]  Date of birth: 1940/04/12  Subjective Chief Complaint  Patient presents with   Leg Pain   Mood    HPI [name] is a 81 y.o. female here today for follow up.   She had nerve conduction study recently which showed chronic S1 radiculopathy.  She continues to have pain down the legs.  This has improved since discontinuing her statin.  She denies any weakness in her legs.  ROS:  A comprehensive ROS was completed and negative except as noted per HPI  Allergies  Allergen Reactions   Other Hives    dristan-over the counter cold prep that is off the shelf now per pt,     Past Medical History:  Diagnosis Date   Allergy    Basal cell carcinoma    Cancer ([place])    basal cell    Cataract    beginning   Chronic kidney disease    kidney stones   Essential tremor     Past Surgical History:  Procedure Laterality Date   APPENDECTOMY  1962   BASAL CELL CARCINOMA EXCISION  2003   BLEPHAROPLASTY  2015   upper/lower    BREAST [place]  2009   CHOLECYSTECTOMY  2003   COLONOSCOPY  2006   CYSTOSCOPY  2008   with stent   [place]   LITHOTRIPSY  2008   LITHOTRIPSY  2015   SIGMOIDOSCOPY  2010   [place]    Social History   Socioeconomic History   Marital status: Married    Spouse name: Not on file   Number of children: 2   Years of education: Not on file   Highest education level: Not on file  Occupational History   Occupation: Retired  Tobacco Use   Smoking status: Never   Smokeless tobacco: Never  Vaping Use   Vaping Use: Never used  Substance and Sexual Activity   Alcohol use: Yes    Comment: 2 glasses of wine monthly   Drug use: No   Sexual activity: Not Currently    Partners: Male  Other Topics Concern   Not on file  Social History Narrative   Not on file   Social Determinants of Health    Financial Resource Strain: Not on file  Food Insecurity: Not on file  Transportation Needs: Not on file  Physical Activity: Not on file  Stress: Not on file  Social Connections: Not on file    Family History  Problem Relation Age of Onset   Heart disease Mother    Diabetes Mother    Kidney disease Mother    Hypertension Mother    Heart disease Father 41   Heart attack Father    Stroke Father    Colon cancer Paternal Aunt    Heart disease Paternal Uncle 16        passed away 41    Heart disease Paternal Grandmother    Heart disease Paternal Grandfather    Heart disease Paternal Uncle    Heart disease Maternal Aunt    Heart disease Maternal Uncle    Brain cancer Maternal Aunt    Breast cancer Sister     Health Maintenance  Topic Date Due   COVID-19 Vaccine (4 - Pfizer risk series) 01/16/2022 (Originally 12/16/2019)   Zoster Vaccines- Shingrix (1 of 2) 01/16/2022 (Originally 12/13/1961)  INFLUENZA VACCINE  04/16/2022 (Originally 08/16/2021)   TETANUS/TDAP  09/01/2022 (Originally 12/13/1961)   Hepatitis C Screening  09/01/2022 (Originally 12/13/1960)   Pneumonia Vaccine 57+ Years old  Completed   DEXA SCAN  Completed   HPV VACCINES  Aged Out     ----------------------------------------------------------------------------------------------------------------------------------------------------------------------------------------------------------------- Physical Exam BP 110/74 (BP Location: Left Arm, Patient Position: Sitting, Cuff Size: Normal)   Pulse 97   Ht '5\' 5"'$  (1.651 m)   Wt 160 lb (72.6 kg)   SpO2 95%   BMI 26.63 kg/m   Physical Exam Constitutional:      Appearance: Normal appearance.  Neurological:     Mental Status: She is alert.  Psychiatric:        Mood and Affect: Mood normal.        Behavior: Behavior normal.      ------------------------------------------------------------------------------------------------------------------------------------------------------------------------------------------------------------------- Assessment and Plan  Low back pain Low back pain with radicular features.  Recent nerve conduction with S1 radiculopathy.  She has had improvement of her pain since stopping her statin.  She would like to hold off on MRI at this time for further evaluation.  She will let me know if symptoms worsen.   No orders of the defined types were placed in this encounter.   Return in about 4 months (around 12/31/2021) for cholesterol.    This visit occurred during the SARS-CoV-2 public health emergency.  Safety protocols were in place, including screening questions prior to the visit, additional usage of staff PPE, and extensive cleaning of exam room while observing appropriate contact time as indicated for disinfecting solutions.

## 2021-09-07 ENCOUNTER — Encounter: Payer: Self-pay | Admitting: General Practice

## 2021-09-15 ENCOUNTER — Encounter: Payer: Medicare Other | Admitting: Neurology

## 2021-09-29 ENCOUNTER — Ambulatory Visit
Admission: EM | Admit: 2021-09-29 | Discharge: 2021-09-29 | Disposition: A | Discharge status: Home / Self Care | Payer: Medicare Other | Attending: Internal Medicine | Admitting: Internal Medicine

## 2021-09-29 ENCOUNTER — Telehealth: Payer: Self-pay

## 2021-09-29 DIAGNOSIS — Z20822 Contact with and (suspected) exposure to covid-19: Secondary | ICD-10-CM | POA: Insufficient documentation

## 2021-09-29 DIAGNOSIS — N189 Chronic kidney disease, unspecified: Secondary | ICD-10-CM | POA: Diagnosis not present

## 2021-09-29 DIAGNOSIS — R110 Nausea: Secondary | ICD-10-CM | POA: Diagnosis present

## 2021-09-29 DIAGNOSIS — G250 Essential tremor: Secondary | ICD-10-CM | POA: Diagnosis not present

## 2021-09-29 DIAGNOSIS — R509 Fever, unspecified: Secondary | ICD-10-CM | POA: Diagnosis not present

## 2021-09-29 LAB — RESP PANEL BY RT-PCR (FLU A&B, COVID) ARPGX2
Influenza A by PCR: NEGATIVE
Influenza B by PCR: NEGATIVE
SARS Coronavirus 2 by RT PCR: NEGATIVE

## 2021-09-29 MED ORDER — ONDANSETRON 4 MG PO TBDP
4.0000 mg | ORAL_TABLET | Freq: Once | ORAL | Status: AC
  Administered 2021-09-29: 4 mg via ORAL

## 2021-09-29 MED ORDER — ACETAMINOPHEN 500 MG PO TABS
1000.0000 mg | ORAL_TABLET | Freq: Once | ORAL | Status: AC
  Administered 2021-09-29: 1000 mg via ORAL

## 2021-09-29 MED ORDER — ONDANSETRON 8 MG PO TBDP: 8.0000 mg | ORAL_TABLET | Freq: Three times a day (TID) | ORAL | 0 refills | Status: DC | PRN

## 2021-09-29 NOTE — ED Provider Notes (Signed)
[facility]    CSN: [id number] Arrival date & time: 09/29/21  0947      History   Chief Complaint Chief Complaint  Patient presents with   Nausea    HPI [name] is a 81 y.o. female.   HPI Pleasant 81 year old female presents with a nausea and headache since yesterday.  PMH significant for CKD, essential tremor, and BCC.  Past Medical History:  Diagnosis Date   Allergy    Basal cell carcinoma    Cancer ([place])    basal cell    Cataract    beginning   Chronic kidney disease    kidney stones   Essential tremor     Patient Active Problem List   Diagnosis Date Noted   Hx of nonmelanoma skin cancer 07/20/2021   Well adult exam 03/30/2020   Positive ANA (antinuclear antibody) 11/21/2019   Disturbance of skin sensation 10/15/2019   Swelling of right knee joint 09/18/2019   Closed fracture of metaphysis of distal end of right radius 04/01/2019   Major depressive disorder with single episode, in remission ([place]) 08/12/2018   Ureteral stone 12/17/2013   Calculus of right kidney 08/18/2013   Left carotid bruit 02/10/2013   Osteoporosis 01/01/2013   New onset of headaches after age 50 10/18/2012   Mixed hyperlipidemia 08/05/2012   Carotid artery stenosis 05/26/2010   Low back pain 03/09/2010   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      Home Medications    Prior to Admission medications    Medication Sig Start Date End Date Taking? Authorizing Provider  ondansetron (ZOFRAN-ODT) 8 MG disintegrating tablet Take 1 tablet (8 mg total) by mouth every 8 (eight) hours as needed for nausea or vomiting. 09/29/21  Yes Eliezer Lofts, FNP  aspirin EC 81 MG tablet Take 81 mg by mouth. 08/06/12   [provider]  calcium-vitamin D (OSCAL) 250-125 MG-UNIT per tablet Take 1,200 mg by mouth.    [provider]  Cholecalciferol (VITAMIN D3) 250 MCG (10000 UT) capsule     [provider]  Cyanocobalamin (B-12) 1000 MCG CAPS Take 500 mcg by mouth.     [provider]  FLUoxetine (PROZAC) 20 MG capsule TAKE 1 CAPSULE BY MOUTH EVERY DAY 08/05/21   Luetta Nutting, DO  FLUoxetine (PROZAC) 40 MG capsule Take 1 capsule (40 mg total) by mouth daily. 03/03/21   Luetta Nutting, DO  Magnesium Sulfate 70 MG CAPS Take 500 mg by mouth.    [provider]    Family History Family History  Problem Relation Age of Onset   Heart disease Mother    Diabetes Mother    Kidney disease Mother    Hypertension Mother    Heart disease Father 27   Heart attack Father  Stroke Father    Colon cancer Paternal Aunt    Heart disease Paternal Uncle 8        passed away 25    Heart disease Paternal Grandmother    Heart disease Paternal Grandfather    Heart disease Paternal Uncle    Heart disease Maternal Aunt    Heart disease Maternal Uncle    Brain cancer Maternal Aunt    Breast cancer Sister     Social History Social History   Tobacco Use   Smoking status: Never   Smokeless tobacco: Never  Vaping Use   Vaping Use: Never used  Substance Use Topics   Alcohol use: Yes    Comment: 2 glasses of wine monthly   Drug use: No     Allergies   Other   Review of Systems Review of Systems  Constitutional:  Positive for fever.  Gastrointestinal:  Positive for nausea.  Neurological:  Positive for headaches.  All other systems reviewed and are  negative.    Physical Exam Triage Vital Signs ED Triage Vitals  Enc Vitals Group     BP 09/29/21 1040 132/82     Pulse Rate 09/29/21 1040 75     Resp 09/29/21 1040 20     Temp 09/29/21 1040 99.7 F (37.6 C)     Temp Source 09/29/21 1040 Oral     SpO2 09/29/21 1040 97 %     Weight 09/29/21 1038 158 lb (71.7 kg)     Height 09/29/21 1038 '5\' 5"'$  (1.651 m)     Head Circumference --      Peak Flow --      Pain Score 09/29/21 1036 0     Pain Loc --      Pain Edu? --      Excl. in [place]? --    No data found.  Updated Vital Signs BP 132/82 (BP Location: Right Arm)   Pulse 75   Temp 99.7 F (37.6 C) (Oral)   Resp 20   Ht '5\' 5"'$  (1.651 m)   Wt 158 lb (71.7 kg)   SpO2 97%   BMI 26.29 kg/m      Physical Exam Vitals and nursing note reviewed.  Constitutional:      Appearance: Normal appearance. She is normal weight. She is ill-appearing.  HENT:     Head: Normocephalic.     Right Ear: Tympanic membrane, ear canal and external ear normal.     Left Ear: Tympanic membrane, ear canal and external ear normal.     Mouth/Throat:     Mouth: Mucous membranes are moist.     Pharynx: Oropharynx is clear.  Eyes:     Extraocular Movements: Extraocular movements intact.     Conjunctiva/sclera: Conjunctivae normal.     Pupils: Pupils are equal, round, and reactive to light.  Cardiovascular:     Rate and Rhythm: Normal rate and regular rhythm.     Pulses: Normal pulses.     Heart sounds: Normal heart sounds.  Pulmonary:     Effort: Pulmonary effort is normal.     Breath sounds: Normal breath sounds. No wheezing, rhonchi or rales.  Musculoskeletal:        General: Normal range of motion.     Cervical back: Normal range of motion and neck supple.  Skin:    General: Skin is warm and dry.  Neurological:     General: No focal deficit present.     Mental Status: She is alert and oriented  to person, place, and time.      UC Treatments / Results  Labs (all labs ordered are listed, but  only abnormal results are displayed) Labs Reviewed  RESP PANEL BY RT-PCR (FLU A&B, COVID) [id number]    EKG   Radiology No results found.  Procedures Procedures (including critical care time)  Medications Ordered in UC Medications  ondansetron (ZOFRAN-ODT) disintegrating tablet 4 mg (4 mg Oral Given 09/29/21 1041)  acetaminophen (TYLENOL) tablet 1,000 mg (1,000 mg Oral Given 09/29/21 1045)    Initial Impression / Assessment and Plan / UC Course  I have reviewed the triage vital signs and the nursing notes.  Pertinent labs & imaging results that were available during my care of the patient were reviewed by me and considered in my medical decision making (see chart for details).     MDM: 1.  Fever-lab 10093 ordered, Tylenol 1000 mg given once in clinic prior to discharge; 2.  Nausea-Zofran 8 mg given once in clinic prior to discharge, Rx'd Zofran. Advised patient may take OTC Tylenol 1000 to 4000 mg daily, as needed for fever.  Advised patient may to take Zofran daily or as needed for nausea.  Encouraged patient increase daily water intake while taking these medications.  Advised patient we will follow-up with PCR COVID-19 results once received.  Patient discharged home, hemodynamically stable. Final Clinical Impressions(s) / UC Diagnoses   Final diagnoses:  Fever, unspecified  Nausea     Discharge Instructions      Advised patient may take OTC Tylenol 1000 to 4000 mg daily, as needed for fever.  Advised patient may to take Zofran daily or as needed for nausea.  Encouraged patient increase daily water intake while taking these medications.  Advised patient we will follow-up with PCR COVID-19 results once received.     ED Prescriptions     Medication Sig Dispense Auth. Provider   ondansetron (ZOFRAN-ODT) 8 MG disintegrating tablet Take 1 tablet (8 mg total) by mouth every 8 (eight) hours as needed for nausea or vomiting. 24 tablet Eliezer Lofts, FNP      PDMP not reviewed  this encounter.   Eliezer Lofts, [place] 09/29/21 1128

## 2021-09-29 NOTE — Discharge Instructions (Addendum)
Advised patient may take OTC Tylenol 1000 to 4000 mg daily, as needed for fever.  Advised patient may to take Zofran daily or as needed for nausea.  Encouraged patient increase daily water intake while taking these medications.  Advised patient we will follow-up with PCR COVID-19 results once received.

## 2021-09-29 NOTE — ED Triage Notes (Signed)
Pt presents to Urgent Care with c/o nausea since yesterday. Headache at onset yesterday, not currently. No vomiting.

## 2021-09-29 NOTE — Telephone Encounter (Signed)
VM received from pt, inquiring about COVID test results from this morning at [facility]. Returned call to phone number given and there was no answer; VM left informing of negative COVID test. Advised to return to [facility] or go to ED if s/s persist or worsen, especially if she feels as if she's becoming dehydrated.

## 2021-10-14 ENCOUNTER — Encounter: Payer: Self-pay | Admitting: Family Medicine

## 2021-11-03 ENCOUNTER — Other Ambulatory Visit: Payer: Self-pay | Admitting: Family Medicine

## 2022-01-02 ENCOUNTER — Ambulatory Visit (INDEPENDENT_AMBULATORY_CARE_PROVIDER_SITE_OTHER): Payer: Medicare Other | Admitting: Family Medicine

## 2022-01-02 ENCOUNTER — Encounter: Payer: Self-pay | Admitting: Family Medicine

## 2022-01-02 VITALS — BP 134/77 | HR 94 | Ht 65.0 in | Wt 158.0 lb

## 2022-01-02 DIAGNOSIS — G8929 Other chronic pain: Secondary | ICD-10-CM | POA: Diagnosis not present

## 2022-01-02 DIAGNOSIS — M5417 Radiculopathy, lumbosacral region: Secondary | ICD-10-CM | POA: Diagnosis not present

## 2022-01-02 DIAGNOSIS — M545 Low back pain, unspecified: Secondary | ICD-10-CM | POA: Diagnosis not present

## 2022-01-02 DIAGNOSIS — E782 Mixed hyperlipidemia: Secondary | ICD-10-CM | POA: Diagnosis not present

## 2022-01-02 DIAGNOSIS — Z23 Encounter for immunization: Secondary | ICD-10-CM

## 2022-01-09 NOTE — Progress Notes (Signed)
[name] - 81 y.o. female MRN [id number]  Date of birth: 05-20-1940  Subjective Chief Complaint  Patient presents with   Leg Pain    HPI [name] is a 81 year old female here today for follow-up visit.  Continues to have leg pain.  She had nerve conduction study completed a few months ago showing chronic S1 radiculopathy.  She denies any weakness, bowel or bladder incontinence.  Doing well with current strength of fluoxetine.  No significant side effects at this time.  ROS:  A comprehensive ROS was completed and negative except as noted per HPI  Allergies  Allergen Reactions   Other Hives    dristan-over the counter cold prep that is off the shelf now per pt,     Past Medical History:  Diagnosis Date   Allergy    Basal cell carcinoma    Cancer ([place])    basal cell    Cataract    beginning   Chronic kidney disease    kidney stones   Essential tremor     Past Surgical History:  Procedure Laterality Date   APPENDECTOMY  1962   BASAL CELL CARCINOMA EXCISION  2003   BLEPHAROPLASTY  2015   upper/lower    [place]  2009   CHOLECYSTECTOMY  2003   COLONOSCOPY  2006   CYSTOSCOPY  2008   with stent   [place]   LITHOTRIPSY  2008   LITHOTRIPSY  2015   SIGMOIDOSCOPY  2010   [place]    Social History   Socioeconomic History   Marital status: Married    Spouse name: Not on file   Number of children: 2   Years of education: Not on file   Highest education level: Not on file  Occupational History   Occupation: Retired  Tobacco Use   Smoking status: Never   Smokeless tobacco: Never  Vaping Use   Vaping Use: Never used  Substance and Sexual Activity   Alcohol use: Yes    Comment: 2 glasses of wine monthly   Drug use: No   Sexual activity: Not Currently    Partners: Male  Other Topics Concern   Not on file  Social History  Narrative   Not on file   Social Determinants of Health   Financial Resource Strain: Not on file  Food Insecurity: Not on file  Transportation Needs: Not on file  Physical Activity: Not on file  Stress: Not on file  Social Connections: Not on file    Family History  Problem Relation Age of Onset   Heart disease Mother    Diabetes Mother    Kidney disease Mother    Hypertension Mother    Heart disease Father 71   Heart attack Father    Stroke Father    Colon cancer Paternal Aunt    Heart disease Paternal Uncle 76        passed away 72    Heart disease Paternal Grandmother    Heart disease Paternal Grandfather    Heart disease Paternal Uncle    Heart disease Maternal Aunt    Heart disease Maternal Uncle    Brain cancer Maternal Aunt    Breast cancer Sister     Health Maintenance  Topic Date Due   DTaP/Tdap/Td (1 - Tdap) Never done   Zoster Vaccines- Shingrix (1 of 2) 01/16/2022 (Originally 12/13/1961)  Medicare Annual Wellness (AWV)  02/02/2022 (Originally 08/12/2019)   Hepatitis C Screening  09/01/2022 (Originally 12/13/1960)   COVID-19 Vaccine (5 - 2023-24 season) 02/27/2022   Pneumonia Vaccine 48+ Years old  Completed   INFLUENZA VACCINE  Completed   DEXA SCAN  Completed   HPV VACCINES  Aged Out     ----------------------------------------------------------------------------------------------------------------------------------------------------------------------------------------------------------------- Physical Exam BP 134/77 (BP Location: Left Arm, Patient Position: Sitting, Cuff Size: Normal)   Pulse 94   Ht '5\' 5"'$  (1.651 m)   Wt 158 lb (71.7 kg)   SpO2 95%   BMI 26.29 kg/m   Physical Exam Constitutional:      Appearance: Normal appearance.  HENT:     Head: Normocephalic and atraumatic.  Eyes:     General: No scleral icterus. Cardiovascular:     Rate and Rhythm: Normal rate and regular rhythm.  Pulmonary:     Effort: Pulmonary effort is normal.      Breath sounds: Normal breath sounds.  Musculoskeletal:     Cervical back: Neck supple.  Neurological:     General: No focal deficit present.     Mental Status: She is alert.  Psychiatric:        Mood and Affect: Mood normal.        Behavior: Behavior normal.     ------------------------------------------------------------------------------------------------------------------------------------------------------------------------------------------------------------------- Assessment and Plan  Mixed hyperlipidemia Updated lipid panel.  Low back pain Continues to have low back pain with some pain into the legs.  Nerve conduction study with chronic S1 radiculopathy.  MRI lumbar spine ordered.   No orders of the defined types were placed in this encounter.   Return for Schedule Medicare Annual Wellness .    This visit occurred during the SARS-CoV-2 public health emergency.  Safety protocols were in place, including screening questions prior to the visit, additional usage of staff PPE, and extensive cleaning of exam room while observing appropriate contact time as indicated for disinfecting solutions.

## 2022-01-09 NOTE — Assessment & Plan Note (Signed)
Continues to have low back pain with some pain into the legs.  Nerve conduction study with chronic S1 radiculopathy.  MRI lumbar spine ordered.

## 2022-01-09 NOTE — Assessment & Plan Note (Signed)
Updated lipid panel.

## 2022-01-21 ENCOUNTER — Ambulatory Visit (INDEPENDENT_AMBULATORY_CARE_PROVIDER_SITE_OTHER): Payer: Medicare Other

## 2022-01-21 DIAGNOSIS — M5417 Radiculopathy, lumbosacral region: Secondary | ICD-10-CM

## 2022-01-21 DIAGNOSIS — M79669 Pain in unspecified lower leg: Secondary | ICD-10-CM | POA: Diagnosis not present

## 2022-02-01 ENCOUNTER — Other Ambulatory Visit: Payer: Self-pay | Admitting: Sports Medicine

## 2022-02-06 ENCOUNTER — Other Ambulatory Visit: Payer: Self-pay | Admitting: Family Medicine

## 2022-02-06 MED ORDER — FLUOXETINE HCL 20 MG PO CAPS: 20.0000 mg | ORAL_CAPSULE | Freq: Every day | ORAL | 0 refills | Status: DC

## 2022-02-24 ENCOUNTER — Other Ambulatory Visit: Payer: Self-pay | Admitting: Family Medicine

## 2022-02-24 DIAGNOSIS — F325 Major depressive disorder, single episode, in full remission: Secondary | ICD-10-CM

## 2022-03-16 ENCOUNTER — Encounter: Payer: Self-pay | Admitting: Family Medicine

## 2022-03-16 ENCOUNTER — Telehealth (INDEPENDENT_AMBULATORY_CARE_PROVIDER_SITE_OTHER): Payer: Medicare Other | Admitting: Family Medicine

## 2022-03-16 VITALS — Temp 100.4°F | Ht 65.0 in | Wt 158.0 lb

## 2022-03-16 DIAGNOSIS — U071 COVID-19: Secondary | ICD-10-CM

## 2022-03-16 MED ORDER — NIRMATRELVIR/RITONAVIR (PAXLOVID)TABLET: 3.0000 | ORAL_TABLET | Freq: Two times a day (BID) | ORAL | 0 refills | Status: AC

## 2022-03-16 NOTE — Assessment & Plan Note (Signed)
She has not tested however given her recent close exposure to her husband who tested positive and current symptoms we will treat as such.  Adding Paxlovid.  Recommend continued supportive care with increase fluids and over-the-counter analgesics/antipyretics.

## 2022-03-16 NOTE — Progress Notes (Signed)
[name] - 82 y.o. female MRN [id number]  Date of birth: Sep 16, 1940   This visit type was conducted due to national recommendations for restrictions regarding the COVID-19 Pandemic (e.g. social distancing).  This format is felt to be most appropriate for this patient at this time.  All issues noted in this document were discussed and addressed.  No physical exam was performed (except for noted visual exam findings with Video Visits).  I discussed the limitations of evaluation and management by telemedicine and the availability of in person appointments. The patient expressed understanding and agreed to proceed.  I connected withNAME@ on 03/16/22 at 11:30 AM EST by a video enabled telemedicine application and verified that I am speaking with the correct person using two identifiers.  Present at visit: Joan Nutting, DO Mahlon Gammon   Patient Location: Home [place] [address]   Provider location:   [facility]  Chief Complaint  Patient presents with   Covid Positive    HPI  [name] is a 82 y.o. female who presents via audio/video conferencing for a telehealth visit today.  She has complaint of congestion, headache, fever and fatigue.  Husband had COVID last week.  She has not tested and feels like she has this as well.  She does have mild cough.  She denies shortness of breath or wheezing.  She has not had chest pain.  She is using Tylenol as needed.   ROS:  A comprehensive ROS was completed and negative except as noted per HPI  Past Medical History:  Diagnosis Date   Allergy    Basal cell carcinoma    Cancer ([place])    basal cell    Cataract    beginning   Chronic kidney disease    kidney stones   Essential tremor     Past Surgical History:  Procedure Laterality Date   APPENDECTOMY  1962   BASAL CELL CARCINOMA EXCISION  2003   BLEPHAROPLASTY  2015   upper/lower    [place]  2009    CHOLECYSTECTOMY  2003   COLONOSCOPY  2006   CYSTOSCOPY  2008   with stent   [place]   LITHOTRIPSY  2008   LITHOTRIPSY  2015   SIGMOIDOSCOPY  2010   [place]    Family History  Problem Relation Age of Onset   Heart disease Mother    Diabetes Mother    Kidney disease Mother    Hypertension Mother    Heart disease Father 77   Heart attack Father    Stroke Father    Colon cancer Paternal Aunt    Heart disease Paternal Uncle 45        passed away 32    Heart disease Paternal Grandmother    Heart disease Paternal Grandfather    Heart disease Paternal Uncle    Heart disease Maternal Aunt    Heart disease Maternal Uncle    Brain cancer Maternal Aunt    Breast cancer Sister     Social History   Socioeconomic History   Marital status: Married    Spouse name: Not on file   Number of children: 2   Years of education: Not on file   Highest education level: Not on file  Occupational History   Occupation: Retired  Tobacco Use   Smoking status: Never   Smokeless  tobacco: Never  Vaping Use   Vaping Use: Never used  Substance and Sexual Activity   Alcohol use: Yes    Comment: 2 glasses of wine monthly   Drug use: No   Sexual activity: Not Currently    Partners: Male  Other Topics Concern   Not on file  Social History Narrative   Not on file   Social Determinants of Health   Financial Resource Strain: Not on file  Food Insecurity: Not on file  Transportation Needs: Not on file  Physical Activity: Not on file  Stress: Not on file  Social Connections: Not on file  Intimate Partner Violence: Not on file     Current Outpatient Medications:    nirmatrelvir/ritonavir (PAXLOVID) 20 x 150 MG & 10 x '100MG'$  TABS, Take 3 tablets by mouth 2 (two) times daily for 5 days. (Take nirmatrelvir 150 mg two tablets twice daily for 5 days and ritonavir 100 mg one tablet twice daily for 5 days) Patient GFR is 81, Disp:  30 tablet, Rfl: 0   aspirin EC 81 MG tablet, Take 81 mg by mouth., Disp: , Rfl:    calcium-vitamin D (OSCAL) 250-125 MG-UNIT per tablet, Take 1,200 mg by mouth., Disp: , Rfl:    Cholecalciferol (VITAMIN D3) 250 MCG (10000 UT) capsule, , Disp: , Rfl:    Cyanocobalamin (B-12) 1000 MCG CAPS, Take 500 mcg by mouth. , Disp: , Rfl:    FLUoxetine (PROZAC) 20 MG capsule, Take 1 capsule (20 mg total) by mouth daily., Disp: 90 capsule, Rfl: 0   FLUoxetine (PROZAC) 40 MG capsule, TAKE 1 CAPSULE (40 MG TOTAL) BY MOUTH DAILY., Disp: 90 capsule, Rfl: 3   Magnesium Sulfate 70 MG CAPS, Take 500 mg by mouth., Disp: , Rfl:   EXAM:  VITALS per patient if applicable: Temp (!) 99991111 F (38 C)   Ht '5\' 5"'$  (1.651 m)   Wt 158 lb (71.7 kg)   BMI 26.29 kg/m   GENERAL: alert, oriented, appears well and in no acute distress  HEENT: atraumatic, conjunttiva clear, no obvious abnormalities on inspection of external nose and ears  NECK: normal movements of the head and neck  LUNGS: on inspection no signs of respiratory distress, breathing rate appears normal, no obvious gross SOB, gasping or wheezing  CV: no obvious cyanosis  MS: moves all visible extremities without noticeable abnormality  PSYCH/NEURO: pleasant and cooperative, no obvious depression or anxiety, speech and thought processing grossly intact  ASSESSMENT AND PLAN:  Discussed the following assessment and plan:  COVID-19 She has not tested however given her recent close exposure to her husband who tested positive and current symptoms we will treat as such.  Adding Paxlovid.  Recommend continued supportive care with increase fluids and over-the-counter analgesics/antipyretics.     I discussed the assessment and treatment plan with the patient. The patient was provided an opportunity to ask questions and all were answered. The patient agreed with the plan and demonstrated an understanding of the instructions.   The patient was advised to call  back or seek an in-person evaluation if the symptoms worsen or if the condition fails to improve as anticipated.    Joan Nutting, DO

## 2022-03-16 NOTE — Progress Notes (Signed)
Symptoms started Tuesday.  Headache, Congestion, Sore throat. Temp 100.4 Meds:  Took spouses medication starting last night. Oscillococcinum: Homeopathic Medicine for Flu Symptoms

## 2022-04-03 ENCOUNTER — Encounter: Payer: Self-pay | Admitting: Family Medicine

## 2022-04-06 ENCOUNTER — Telehealth: Payer: Self-pay | Admitting: Family Medicine

## 2022-04-06 NOTE — Telephone Encounter (Signed)
Contacted Mahlon Gammon to schedule their annual wellness visit. Appointment made for 04/07/2022 at [place] [facility] Direct Dial: [phone]

## 2022-04-07 ENCOUNTER — Ambulatory Visit (INDEPENDENT_AMBULATORY_CARE_PROVIDER_SITE_OTHER): Payer: Medicare Other | Admitting: Family Medicine

## 2022-04-07 VITALS — BP 141/76

## 2022-04-07 DIAGNOSIS — Z0000 Encounter for general adult medical examination without abnormal findings: Secondary | ICD-10-CM

## 2022-04-07 NOTE — Progress Notes (Signed)
MEDICARE ANNUAL WELLNESS VISIT  04/07/2022  Telephone Visit Disclaimer This Medicare AWV was conducted by telephone due to national recommendations for restrictions regarding the COVID-19 Pandemic (e.g. social distancing).  I verified, using two identifiers, that I am speaking with [name] or their authorized healthcare agent. I discussed the limitations, risks, security, and privacy concerns of performing an evaluation and management service by telephone and the potential availability of an in-person appointment in the future. The patient expressed understanding and agreed to proceed.  Location of Patient: Home Location of Provider (nurse):  In the office.  Subjective:    [name] is a 82 y.o. female patient of Joan Nutting, DO who had a Medicare Annual Wellness Visit today via telephone. [name] is Retired and lives with their spouse. she has 2 children. she reports that she is socially active and does interact with friends/family regularly. she is minimally physically active and enjoys reading.  Patient Care Team: Joan Nutting, DO as PCP - General (Family Medicine)     04/07/2022   10:59 AM 07/29/2019   10:44 AM 04/20/2014    2:05 PM 04/06/2014    1:23 PM  Advanced Directives  Does Patient Have a Medical Advance Directive? Yes Yes Yes Yes  Type of Advance Directive Living will [place];Living will;Out of facility DNR (pink MOST or yellow form) [place];Living will [place];Living will  Does patient want to make changes to medical advance directive? No - Patient declined No - Patient declined      Hospital Utilization Over the Past 12 Months: # of hospitalizations or ER visits: 1 # of surgeries: 0  Review of Systems    Patient reports that her overall health is unchanged compared to last year.  History obtained from chart review and the patient  Patient Reported Readings (BP, Pulse, CBG, Weight,  etc) BP 141/76  Pain Assessment Pain : No/denies pain     Current Medications & Allergies (verified) Allergies as of 04/07/2022       Reactions   Other Hives   dristan-over the counter cold prep that is off the shelf now per pt,        Medication List        Accurate as of April 07, 2022 11:17 AM. If you have any questions, ask your nurse or doctor.          aspirin EC 81 MG tablet Take 81 mg by mouth.   B-12 1000 MCG Caps Take 500 mcg by mouth.   calcium-vitamin D 250-125 MG-UNIT tablet Commonly known as: OSCAL Take 1,200 mg by mouth.   FLUoxetine 20 MG capsule Commonly known as: PROZAC Take 1 capsule (20 mg total) by mouth daily.   FLUoxetine 40 MG capsule Commonly known as: PROZAC TAKE 1 CAPSULE (40 MG TOTAL) BY MOUTH DAILY.   Magnesium Sulfate 70 MG Caps Take 500 mg by mouth.   Vitamin D3 250 MCG (10000 UT) Caps Generic drug: Cholecalciferol        History (reviewed): Past Medical History:  Diagnosis Date   Allergy    Basal cell carcinoma    Cancer ([place])    basal cell    Cataract    beginning   Chronic kidney disease    kidney stones   Essential tremor    Past Surgical History:  Procedure Laterality Date   APPENDECTOMY  1962   BASAL CELL CARCINOMA EXCISION  2003   BLEPHAROPLASTY  2015  upper/lower    [place]   CARDIAC CATHETERIZATION  2009   CHOLECYSTECTOMY  2003   COLONOSCOPY  2006   CYSTOSCOPY  2008   with stent   [place]   LITHOTRIPSY  2008   LITHOTRIPSY  2015   SIGMOIDOSCOPY  2010   TONSILLECTOMY  1951   TUBAL LIGATION  1976   Family History  Problem Relation Age of Onset   Heart disease Mother    Diabetes Mother    Kidney disease Mother    Hypertension Mother    Heart disease Father 53   Heart attack Father    Stroke Father    Colon cancer Paternal Aunt    Heart disease Paternal Uncle 48        passed away 38    Heart disease Paternal Grandmother     Heart disease Paternal Grandfather    Heart disease Paternal Uncle    Heart disease Maternal Aunt    Heart disease Maternal Uncle    Brain cancer Maternal Aunt    Breast cancer Sister    Social History   Socioeconomic History   Marital status: Married    Spouse name: Sharen Heck   Number of children: 2   Years of education: 12   Highest education level: 12th grade  Occupational History   Occupation: Retired  Tobacco Use   Smoking status: Never   Smokeless tobacco: Never  Scientific laboratory technician Use: Never used  Substance and Sexual Activity   Alcohol use: Yes    Comment: 2 glasses of wine monthly   Drug use: No   Sexual activity: Not Currently    Partners: Male  Other Topics Concern   Not on file  Social History Narrative   Lives with husband. She has two children. She enjoys reading.   Social Determinants of Health   Financial Resource Strain: Low Risk  (04/07/2022)   Overall Financial Resource Strain (CARDIA)    Difficulty of Paying Living Expenses: Not hard at all  Food Insecurity: No Food Insecurity (04/07/2022)   Hunger Vital Sign    Worried About Running Out of Food in the Last Year: Never true    Ran Out of Food in the Last Year: Never true  Transportation Needs: No Transportation Needs (04/07/2022)   PRAPARE - Hydrologist (Medical): No    Lack of Transportation (Non-Medical): No  Physical Activity: Inactive (04/07/2022)   Exercise Vital Sign    Days of Exercise per Week: 0 days    Minutes of Exercise per Session: 0 min  Stress: No Stress Concern Present (04/07/2022)   [place]    Feeling of Stress : Not at all  Social Connections: Moderately Integrated (04/07/2022)   Social Connection and Isolation Panel [NHANES]    Frequency of Communication with Friends and Family: Twice a week    Frequency of Social Gatherings with Friends and Family: Once a week    Attends Religious  Services: More than 4 times per year    Active Member of [employer] or Organizations: No    Attends Archivist Meetings: Never    Marital Status: Married    Activities of Daily Living    04/07/2022   11:03 AM  In your present state of health, do you have any difficulty performing the following activities:  Hearing? 0  Vision? 0  Difficulty concentrating or making  decisions? 1  Comment some memory loss  Walking or climbing stairs? 1  Comment knee pain  Dressing or bathing? 0  Doing errands, shopping? 0  Preparing Food and eating ? N  Using the Toilet? N  In the past six months, have you accidently leaked urine? N  Do you have problems with loss of bowel control? N  Managing your Medications? N  Managing your Finances? N  Housekeeping or managing your Housekeeping? N    Patient Education/ Literacy How often do you need to have someone help you when you read instructions, pamphlets, or other written materials from your doctor or pharmacy?: 1 - Never What is the last grade level you completed in school?: 12 th grade  Exercise Current Exercise Habits: The patient does not participate in regular exercise at present, Exercise limited by: orthopedic condition(s) (knee pain)  Diet Patient reports consuming 3 meals a day and 0 snack(s) a day Patient reports that her primary diet is: Regular Patient reports that she does have regular access to food.   Depression Screen    04/07/2022   10:59 AM 03/16/2022   11:34 AM 01/02/2022    1:55 PM 01/02/2022    1:54 PM 08/31/2021    2:00 PM 08/31/2021    1:50 PM 03/03/2021    8:55 AM  PHQ 2/9 Scores  PHQ - 2 Score 0 0 0 0 0 0 1  PHQ- 9 Score   0  0       Fall Risk    04/07/2022   10:59 AM 03/16/2022   11:34 AM 08/31/2021    1:50 PM 03/03/2021    8:55 AM 03/30/2020    2:04 PM  Fall Risk   Falls in the past year? 1 0 0 1 0  Number falls in past yr: 0 0 0 1 0  Injury with Fall? 0 0 0 0 0  Risk for fall due to : History of fall(s)  No Fall Risks No Fall Risks History of fall(s) No Fall Risks  Follow up Falls evaluation completed;Education provided;Falls prevention discussed Falls evaluation completed Falls evaluation completed Falls evaluation completed Falls evaluation completed     Objective:  HAUNANI METHENEY seemed alert and oriented and she participated appropriately during our telephone visit.  Blood Pressure Weight BMI  BP Readings from Last 3 Encounters:  04/07/22 (!) 141/76  01/02/22 134/77  09/29/21 132/82   Wt Readings from Last 3 Encounters:  03/16/22 158 lb (71.7 kg)  01/02/22 158 lb (71.7 kg)  09/29/21 158 lb (71.7 kg)   BMI Readings from Last 1 Encounters:  03/16/22 26.29 kg/m    *Unable to obtain current vital signs, weight, and BMI due to telephone visit type  Hearing/Vision  Satori did not seem to have difficulty with hearing/understanding during the telephone conversation Reports that she has had a formal eye exam by an eye care professional within the past year Reports that she has not had a formal hearing evaluation within the past year *Unable to fully assess hearing and vision during telephone visit type  Cognitive Function:    04/07/2022   11:09 AM  6CIT Screen  What Year? 0 points  What month? 0 points  What time? 0 points  Count back from 20 0 points  Months in reverse 0 points  Repeat phrase 0 points  Total Score 0 points   (Normal:0-7, Significant for Dysfunction: >8)  Normal Cognitive Function Screening: Yes   Immunization & Health Maintenance Record Immunization History  Administered Date(s) Administered   COVID-19, mRNA, vaccine(Comirnaty)12 years and older 01/02/2022   Fluad Quad(high Dose 65+) 10/15/2019, 11/26/2020, 01/02/2022   PFIZER(Purple Top)SARS-COV-2 Vaccination 01/26/2019, 02/17/2019, 10/21/2019   Pneumococcal Conjugate-13 09/09/2014   Pneumococcal Polysaccharide-23 11/11/2009   Zoster, Live 07/23/2008    Health Maintenance  Topic Date Due    DTaP/Tdap/Td (1 - Tdap) Never done   Hepatitis C Screening  09/01/2022 (Originally [birth year]13/1960)   COVID-19 Vaccine (5 - 2023-24 season) 04/01/2023 (Originally 02/27/2022)   Zoster Vaccines- Shingrix (1 of 2) 06/14/2023 (Originally 12/13/1961)   Medicare Annual Wellness (AWV)  04/07/2023   Pneumonia Vaccine 57+ Years old  Completed   INFLUENZA VACCINE  Completed   DEXA SCAN  Completed   HPV VACCINES  Aged Out       Assessment  This is a routine wellness examination for [employer].  Health Maintenance: Due or Overdue Health Maintenance Due  Topic Date Due   DTaP/Tdap/Td (1 - Tdap) Never done    [name] does not need a referral for Community Assistance: Care Management:   no Social Work:    no Prescription Assistance:  no Nutrition/Diabetes Education:  no   Plan:  Personalized Goals  Goals Addressed               This Visit's Progress     Patient Stated (pt-stated)        Patient stated that she would like to continue to maintain her healthy lifestyle.       Personalized Health Maintenance & Screening Recommendations  Shingrix vaccine TD vaccine Bone density scan Mammogram due in August  Lung Cancer Screening Recommended: no (Low Dose CT Chest recommended if Age 38-80 years, 30 pack-year currently smoking OR have quit w/in past 15 years) Hepatitis C Screening recommended: no HIV Screening recommended: no  Advanced Directives: Written information was not prepared per patient's request.  Referrals & Orders No orders of the defined types were placed in this encounter.   Follow-up Plan Follow-up with Joan Nutting, DO as planned Schedule tetanus and shingles vaccine at the pharmacy.  Bone density scan referral has been sent.  Medicare wellness visit in one year.  Patient will access AVS on my chart.   I have personally reviewed and noted the following in the patient's chart:   Medical and social history Use of alcohol, tobacco or illicit  drugs  Current medications and supplements Functional ability and status Nutritional status Physical activity Advanced directives List of other physicians Hospitalizations, surgeries, and ER visits in previous 12 months Vitals Screenings to include cognitive, depression, and falls Referrals and appointments  In addition, I have reviewed and discussed with [name] certain preventive protocols, quality metrics, and best practice recommendations. A written personalized care plan for preventive services as well as general preventive health recommendations is available and can be mailed to the patient at her request.      Tinnie Gens, RN BSN  04/07/2022

## 2022-04-07 NOTE — Patient Instructions (Signed)
[facility] Maintenance Summary and Written Plan of Care  [name] ,  Thank you for allowing me to perform your Medicare Annual Wellness Visit and for your ongoing commitment to your health.   Health Maintenance & Immunization History Health Maintenance  Topic Date Due   DTaP/Tdap/Td (1 - Tdap) Never done   Hepatitis C Screening  09/01/2022 (Originally 12/13/1958)   COVID-19 Vaccine (5 - 2023-24 season) 04/01/2023 (Originally 02/27/2022)   Zoster Vaccines- Shingrix (1 of 2) 06/14/2023 (Originally 12/13/1959)   Medicare Annual Wellness (AWV)  04/07/2023   Pneumonia Vaccine 38+ Years old  Completed   INFLUENZA VACCINE  Completed   DEXA SCAN  Completed   HPV VACCINES  Aged Out   Immunization History  Administered Date(s) Administered   COVID-19, mRNA, vaccine(Comirnaty)12 years and older 01/02/2022   Fluad Quad(high Dose 65+) 10/15/2019, 11/26/2020, 01/02/2022   PFIZER(Purple Top)SARS-COV-2 Vaccination 01/26/2019, 02/17/2019, 10/21/2019   Pneumococcal Conjugate-13 09/09/2014   Pneumococcal Polysaccharide-23 11/11/2009   Zoster, Live 07/23/2008    These are the patient goals that we discussed:  Goals Addressed               This Visit's Progress     Patient Stated (pt-stated)        Patient stated that she would like to continue to maintain her healthy lifestyle.         This is a list of Health Maintenance Items that are overdue or due now: Health Maintenance Due  Topic Date Due   DTaP/Tdap/Td (1 - Tdap) Never done   Shingrix vaccine TD vaccine Bone density scan Mammogram due in August  Orders/Referrals Placed Today: No orders of the defined types were placed in this encounter.  (Contact our referral department at [phone] if you have not spoken with someone about your referral appointment within the next 5 days)    Follow-up Plan Follow-up with Joan Nutting, DO as planned Schedule tetanus and shingles vaccine at the  pharmacy.  Bone density scan referral has been sent.  Medicare wellness visit in one year.  Patient will access AVS on my chart.      Health Maintenance, Female Adopting a healthy lifestyle and getting preventive care are important in promoting health and wellness. Ask your health care provider about: The right schedule for you to have regular tests and exams. Things you can do on your own to prevent diseases and keep yourself healthy. What should I know about diet, weight, and exercise? Eat a healthy diet  Eat a diet that includes plenty of vegetables, fruits, low-fat dairy products, and lean protein. Do not eat a lot of foods that are high in solid fats, added sugars, or sodium. Maintain a healthy weight Body mass index (BMI) is used to identify weight problems. It estimates body fat based on height and weight. Your health care provider can help determine your BMI and help you achieve or maintain a healthy weight. Get regular exercise Get regular exercise. This is one of the most important things you can do for your health. Most adults should: Exercise for at least 150 minutes each week. The exercise should increase your heart rate and make you sweat (moderate-intensity exercise). Do strengthening exercises at least twice a week. This is in addition to the moderate-intensity exercise. Spend less time sitting. Even light physical activity can be beneficial. Watch cholesterol and blood lipids Have your blood tested for lipids and cholesterol at 80 years of age, then have this test every 5  years. Have your cholesterol levels checked more often if: Your lipid or cholesterol levels are high. You are older than 80 years of age. You are at high risk for heart disease. What should I know about cancer screening? Depending on your health history and family history, you may need to have cancer screening at various ages. This may include screening for: Breast cancer. Cervical  cancer. Colorectal cancer. Skin cancer. Lung cancer. What should I know about heart disease, diabetes, and high blood pressure? Blood pressure and heart disease High blood pressure causes heart disease and increases the risk of stroke. This is more likely to develop in people who have high blood pressure readings or are overweight. Have your blood pressure checked: Every 3-5 years if you are 80-65 years of age. Every year if you are 41 years old or older. Diabetes Have regular diabetes screenings. This checks your fasting blood sugar level. Have the screening done: Once every three years after age 58 if you are at a normal weight and have a low risk for diabetes. More often and at a younger age if you are overweight or have a high risk for diabetes. What should I know about preventing infection? Hepatitis B If you have a higher risk for hepatitis B, you should be screened for this virus. Talk with your health care provider to find out if you are at risk for hepatitis B infection. Hepatitis C Testing is recommended for: Everyone born from [birth year] through 1965. Anyone with known risk factors for hepatitis C. Sexually transmitted infections (STIs) Get screened for STIs, including gonorrhea and chlamydia, if: You are sexually active and are younger than 80 years of age. You are older than 80 years of age and your health care provider tells you that you are at risk for this type of infection. Your sexual activity has changed since you were last screened, and you are at increased risk for chlamydia or gonorrhea. Ask your health care provider if you are at risk. Ask your health care provider about whether you are at high risk for HIV. Your health care provider may recommend a prescription medicine to help prevent HIV infection. If you choose to take medicine to prevent HIV, you should first get tested for HIV. You should then be tested every 3 months for as long as you are taking the  medicine. Pregnancy If you are about to stop having your period (premenopausal) and you may become pregnant, seek counseling before you get pregnant. Take 400 to 800 micrograms (mcg) of folic acid every day if you become pregnant. Ask for birth control (contraception) if you want to prevent pregnancy. Osteoporosis and menopause Osteoporosis is a disease in which the bones lose minerals and strength with aging. This can result in bone fractures. If you are 41 years old or older, or if you are at risk for osteoporosis and fractures, ask your health care provider if you should: Be screened for bone loss. Take a calcium or vitamin D supplement to lower your risk of fractures. Be given hormone replacement therapy (HRT) to treat symptoms of menopause. Follow these instructions at home: Alcohol use Do not drink alcohol if: Your health care provider tells you not to drink. You are pregnant, may be pregnant, or are planning to become pregnant. If you drink alcohol: Limit how much you have to: 0-1 drink a day. Know how much alcohol is in your drink. In the [place] one drink equals one 12 oz bottle of beer (355 mL), one  5 oz glass of wine (148 mL), or one 1 oz glass of hard liquor (44 mL). Lifestyle Do not use any products that contain nicotine or tobacco. These products include cigarettes, chewing tobacco, and vaping devices, such as e-cigarettes. If you need help quitting, ask your health care provider. Do not use street drugs. Do not share needles. Ask your health care provider for help if you need support or information about quitting drugs. General instructions Schedule regular health, dental, and eye exams. Stay current with your vaccines. Tell your health care provider if: You often feel depressed. You have ever been abused or do not feel safe at home. Summary Adopting a healthy lifestyle and getting preventive care are important in promoting health and wellness. Follow your health care  provider's instructions about healthy diet, exercising, and getting tested or screened for diseases. Follow your health care provider's instructions on monitoring your cholesterol and blood pressure. This information is not intended to replace advice given to you by your health care provider. Make sure you discuss any questions you have with your health care provider. Document Revised: 05/24/2020 Document Reviewed: 05/24/2020 Elsevier Patient Education  [place].

## 2022-04-20 IMAGING — DX DG ABDOMEN 1V
2 series · 2 of 2 positions shown · non-contrast
Comparison: None.

CLINICAL DATA: Constipation and abdominal pain. Symptoms for a few
weeks.

EXAM:
ABDOMEN - 1 VIEW

[abdomen kub (1 of 2)]
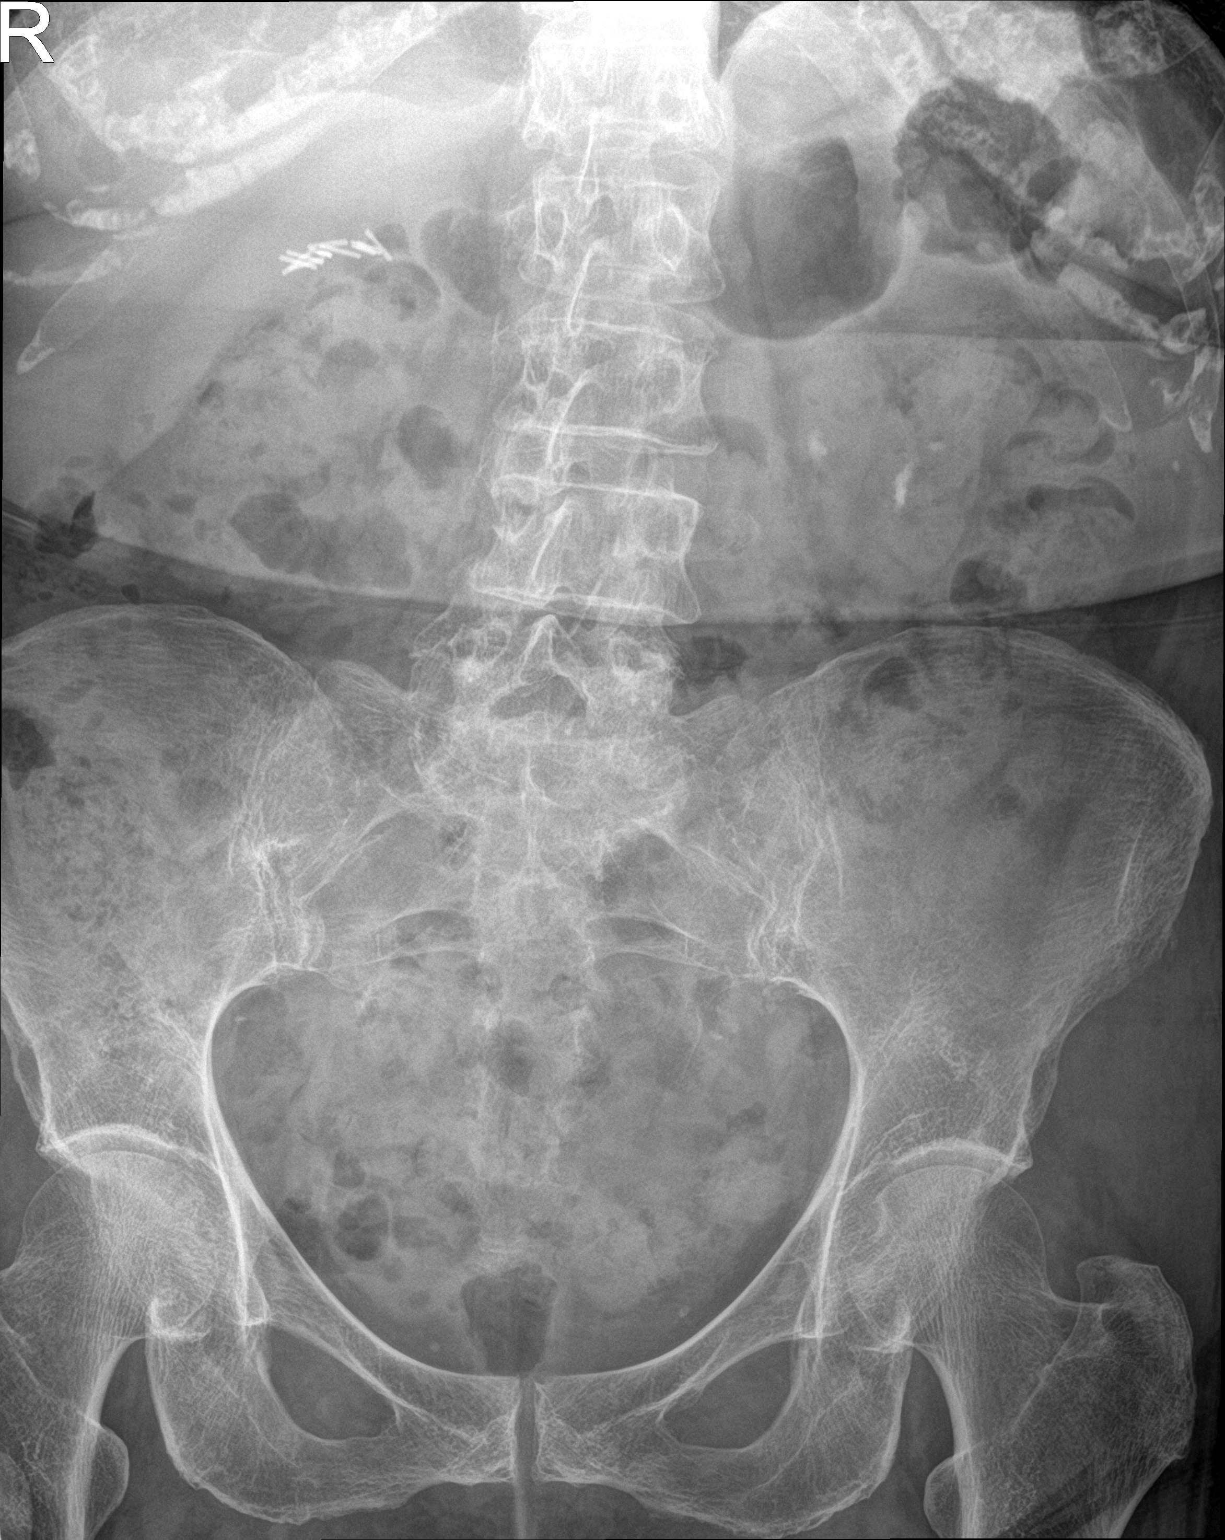

[abdomen kub (2 of 2)]
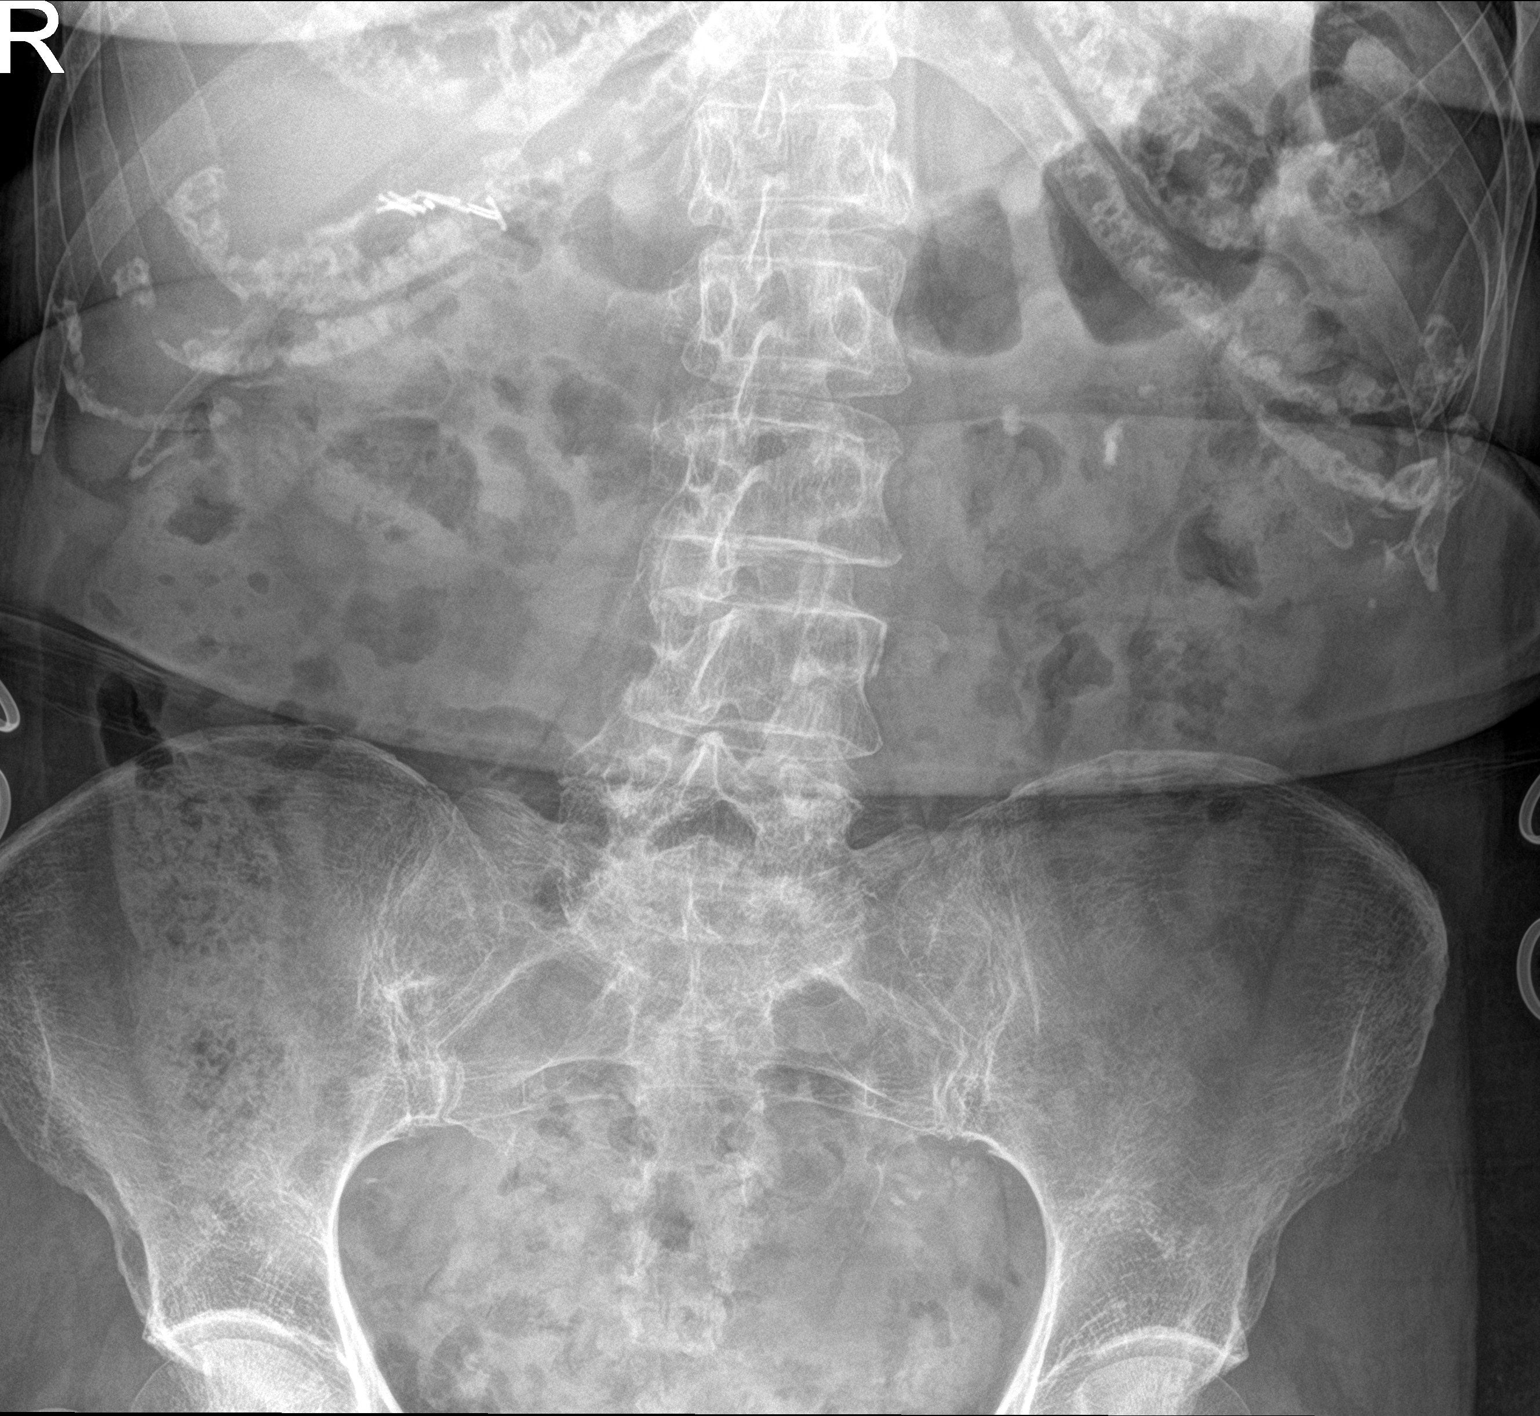

[2 of 2 positions shown; findings below may reference images not displayed]

FINDINGS: No bowel dilatation to suggest obstruction. Small to moderate stool
burden, stool primarily in the right colon. No abnormal rectal
distention. At least 3 calcifications project over the left renal
bed, larger measuring 11 and 7 mm. No definite stones projecting
over the right renal shadow. Cholecystectomy clips in the right
upper quadrant. Calcifications in the right and left pelvis are
typical of phleboliths. No evidence of ureteral stone. No concerning
intraabdominal mass effect. No acute osseous abnormalities are seen.
IMPRESSION: 1. Suspected left renal nephrolithiasis, with at least 3
calcifications projecting over the left renal shadow. No evidence
ureteral stone.
2. Small to moderate stool burden, primarily in the right colon.

## 2022-05-03 ENCOUNTER — Other Ambulatory Visit: Payer: Self-pay | Admitting: Family Medicine

## 2022-07-24 DIAGNOSIS — L720 Epidermal cyst: Secondary | ICD-10-CM | POA: Diagnosis not present

## 2022-07-24 DIAGNOSIS — L309 Dermatitis, unspecified: Secondary | ICD-10-CM | POA: Diagnosis not present

## 2022-07-24 DIAGNOSIS — L814 Other melanin hyperpigmentation: Secondary | ICD-10-CM | POA: Diagnosis not present

## 2022-07-24 DIAGNOSIS — L821 Other seborrheic keratosis: Secondary | ICD-10-CM | POA: Diagnosis not present

## 2022-07-24 DIAGNOSIS — D229 Melanocytic nevi, unspecified: Secondary | ICD-10-CM | POA: Diagnosis not present

## 2022-07-31 ENCOUNTER — Other Ambulatory Visit: Payer: Self-pay | Admitting: Family Medicine

## 2022-09-12 DIAGNOSIS — U071 COVID-19: Secondary | ICD-10-CM | POA: Diagnosis not present

## 2022-09-12 DIAGNOSIS — G44059 Short lasting unilateral neuralgiform headache with conjunctival injection and tearing (SUNCT), not intractable: Secondary | ICD-10-CM | POA: Diagnosis not present

## 2022-09-28 ENCOUNTER — Encounter: Payer: Self-pay | Admitting: Family Medicine

## 2022-09-28 ENCOUNTER — Ambulatory Visit (INDEPENDENT_AMBULATORY_CARE_PROVIDER_SITE_OTHER): Payer: Medicare Other | Admitting: Family Medicine

## 2022-09-28 VITALS — BP 118/67 | HR 90 | Ht 65.0 in | Wt 163.0 lb

## 2022-09-28 DIAGNOSIS — Z23 Encounter for immunization: Secondary | ICD-10-CM | POA: Diagnosis not present

## 2022-09-28 DIAGNOSIS — G629 Polyneuropathy, unspecified: Secondary | ICD-10-CM | POA: Diagnosis not present

## 2022-09-28 DIAGNOSIS — Z0000 Encounter for general adult medical examination without abnormal findings: Secondary | ICD-10-CM | POA: Diagnosis not present

## 2022-09-28 DIAGNOSIS — E782 Mixed hyperlipidemia: Secondary | ICD-10-CM

## 2022-09-28 NOTE — Patient Instructions (Signed)
Preventive Care 65 Years and Older, Female Preventive care refers to lifestyle choices and visits with your health care provider that can promote health and wellness. Preventive care visits are also called wellness exams. What can I expect for my preventive care visit? Counseling Your health care provider may ask you questions about your: Medical history, including: Past medical problems. Family medical history. Pregnancy and menstrual history. History of falls. Current health, including: Memory and ability to understand (cognition). Emotional well-being. Home life and relationship well-being. Sexual activity and sexual health. Lifestyle, including: Alcohol, nicotine or tobacco, and drug use. Access to firearms. Diet, exercise, and sleep habits. Work and work environment. Sunscreen use. Safety issues such as seatbelt and bike helmet use. Physical exam Your health care provider will check your: Height and weight. These may be used to calculate your BMI (body mass index). BMI is a measurement that tells if you are at a healthy weight. Waist circumference. This measures the distance around your waistline. This measurement also tells if you are at a healthy weight and may help predict your risk of certain diseases, such as type 2 diabetes and high blood pressure. Heart rate and blood pressure. Body temperature. Skin for abnormal spots. What immunizations do I need?  Vaccines are usually given at various ages, according to a schedule. Your health care provider will recommend vaccines for you based on your age, medical history, and lifestyle or other factors, such as travel or where you work. What tests do I need? Screening Your health care provider may recommend screening tests for certain conditions. This may include: Lipid and cholesterol levels. Hepatitis C test. Hepatitis B test. HIV (human immunodeficiency virus) test. STI (sexually transmitted infection) testing, if you are at  risk. Lung cancer screening. Colorectal cancer screening. Diabetes screening. This is done by checking your blood sugar (glucose) after you have not eaten for a while (fasting). Mammogram. Talk with your health care provider about how often you should have regular mammograms. BRCA-related cancer screening. This may be done if you have a family history of breast, ovarian, tubal, or peritoneal cancers. Bone density scan. This is done to screen for osteoporosis. Talk with your health care provider about your test results, treatment options, and if necessary, the need for more tests. Follow these instructions at home: Eating and drinking  Eat a diet that includes fresh fruits and vegetables, whole grains, lean protein, and low-fat dairy products. Limit your intake of foods with high amounts of sugar, saturated fats, and salt. Take vitamin and mineral supplements as recommended by your health care provider. Do not drink alcohol if your health care provider tells you not to drink. If you drink alcohol: Limit how much you have to 0-1 drink a day. Know how much alcohol is in your drink. In the [place] one drink equals one 12 oz bottle of beer (355 mL), one 5 oz glass of wine (148 mL), or one 1 oz glass of hard liquor (44 mL). Lifestyle Brush your teeth every morning and night with fluoride toothpaste. Floss one time each day. Exercise for at least 30 minutes 5 or more days each week. Do not use any products that contain nicotine or tobacco. These products include cigarettes, chewing tobacco, and vaping devices, such as e-cigarettes. If you need help quitting, ask your health care provider. Do not use drugs. If you are sexually active, practice safe sex. Use a condom or other form of protection in order to prevent STIs. Take aspirin only as told by   your health care provider. Make sure that you understand how much to take and what form to take. Work with your health care provider to find out whether it  is safe and beneficial for you to take aspirin daily. Ask your health care provider if you need to take a cholesterol-lowering medicine (statin). Find healthy ways to manage stress, such as: Meditation, yoga, or listening to music. Journaling. Talking to a trusted person. Spending time with friends and family. Minimize exposure to UV radiation to reduce your risk of skin cancer. Safety Always wear your seat belt while driving or riding in a vehicle. Do not drive: If you have been drinking alcohol. Do not ride with someone who has been drinking. When you are tired or distracted. While texting. If you have been using any mind-altering substances or drugs. Wear a helmet and other protective equipment during sports activities. If you have firearms in your house, make sure you follow all gun safety procedures. What's next? Visit your health care provider once a year for an annual wellness visit. Ask your health care provider how often you should have your eyes and teeth checked. Stay up to date on all vaccines. This information is not intended to replace advice given to you by your health care provider. Make sure you discuss any questions you have with your health care provider. Document Revised: 06/30/2020 Document Reviewed: 06/30/2020 Elsevier Patient Education  2024 Elsevier Inc.  

## 2022-09-28 NOTE — Assessment & Plan Note (Signed)
Well adult Orders Placed This Encounter  Procedures   CBC with Differential/Platelet   CMP14+EGFR   Lipid Panel With LDL/HDL Ratio   B12   Sed Rate (ESR)   Protein Electrophoresis, (serum)   TSH   HgB A1c   Lead, blood    Order Specific Question:   [place] of residence?    Answer:   [place] [620]   Protein Electrophoresis, Urine Rflx.  Screening: per lab order Immunizations:  Flu vaccine given.  Anticipatory guidance/Risk factor reduction:  Recommendatons per AVS.

## 2022-09-28 NOTE — Assessment & Plan Note (Signed)
Additional labs for work up of neuropathy ordered today.

## 2022-09-28 NOTE — Progress Notes (Signed)
[name] - 82 y.o. female MRN [id number]  Date of birth: May 04, 1940  Subjective Chief Complaint  Patient presents with   Annual Exam    Annual physical and flu shot     HPI [name] is a 82 y.o. female here today for annual exam.   She reports that she is doing pretty well.   She is having burning sensation in bilateral legs.  This occurs only at night.  Legs feel warm to touch during these episodes.   She has been refinishing an Psychologist, sport and exercise that she thinks may have lead paint  She is doing well with current medications.  She is somewhat active.  She feels that her diet is quite good.   She is a non-smoker. Rare EtOH use  She would like flu vaccine today.   Review of Systems  Constitutional:  Negative for chills, fever, malaise/fatigue and weight loss.  HENT:  Negative for congestion, ear pain and sore throat.   Eyes:  Negative for blurred vision, double vision and pain.  Respiratory:  Negative for cough and shortness of breath.   Cardiovascular:  Negative for chest pain and palpitations.  Gastrointestinal:  Negative for abdominal pain, blood in stool, constipation, heartburn and nausea.  Genitourinary:  Negative for dysuria and urgency.  Musculoskeletal:  Negative for joint pain and myalgias.  Neurological:  Negative for dizziness and headaches.  Endo/Heme/Allergies:  Does not bruise/bleed easily.  Psychiatric/Behavioral:  Negative for depression. The patient is not nervous/anxious and does not have insomnia.     Allergies  Allergen Reactions   Other Hives    dristan-over the counter cold prep that is off the shelf now per pt,     Past Medical History:  Diagnosis Date   Allergy    Basal cell carcinoma    Cancer (HCC)    basal cell    Cataract    beginning   Chronic kidney disease    kidney stones   Essential tremor     Past Surgical History:  Procedure Laterality Date   APPENDECTOMY  1962   BASAL CELL CARCINOMA EXCISION  2003   BLEPHAROPLASTY   2015   upper/lower    BREAST FIBROADENOMA SURGERY  1998   CARDIAC CATHETERIZATION  2009   CHOLECYSTECTOMY  2003   COLONOSCOPY  2006   CYSTOSCOPY  2008   with stent   DILATION AND CURETTAGE OF UTERUS  1973   LITHOTRIPSY  2008   LITHOTRIPSY  2015   SIGMOIDOSCOPY  2010   TONSILLECTOMY  1951   TUBAL LIGATION  1976    Social History   Socioeconomic History   Marital status: Married    Spouse name: Quintin Alto   Number of children: 2   Years of education: 12   Highest education level: 12th grade  Occupational History   Occupation: Retired  Tobacco Use   Smoking status: Never   Smokeless tobacco: Never  Vaping Use   Vaping status: Never Used  Substance and Sexual Activity   Alcohol use: Yes    Comment: 2 glasses of wine monthly   Drug use: No   Sexual activity: Not Currently    Partners: Male  Other Topics Concern   Not on file  Social History Narrative   Lives with husband. She has two children. She enjoys reading.   Social Determinants of Health   Financial Resource Strain: Low Risk  (09/21/2022)   Overall Financial Resource Strain (CARDIA)    Difficulty of Paying Living Expenses:  Not hard at all  Food Insecurity: No Food Insecurity (09/21/2022)   Hunger Vital Sign    Worried About Running Out of Food in the Last Year: Never true    Ran Out of Food in the Last Year: Never true  Transportation Needs: No Transportation Needs (09/21/2022)   PRAPARE - Administrator, Civil Service (Medical): No    Lack of Transportation (Non-Medical): No  Physical Activity: Inactive (09/21/2022)   Exercise Vital Sign    Days of Exercise per Week: 0 days    Minutes of Exercise per Session: 0 min  Stress: No Stress Concern Present (09/21/2022)   [employer] of Occupational Health - Occupational Stress Questionnaire    Feeling of Stress : Only a little  Social Connections: Moderately Integrated (09/21/2022)   Social Connection and Isolation Panel [NHANES]    Frequency of  Communication with Friends and Family: Twice a week    Frequency of Social Gatherings with Friends and Family: Once a week    Attends Religious Services: 1 to 4 times per year    Active Member of [employer] or Organizations: No    Attends Banker Meetings: Never    Marital Status: Married    Family History  Problem Relation Age of Onset   Heart disease Mother    Diabetes Mother    Kidney disease Mother    Hypertension Mother    Heart disease Father 31   Heart attack Father    Stroke Father    Colon cancer Paternal Aunt    Heart disease Paternal Uncle 17        passed away 54    Heart disease Paternal Grandmother    Heart disease Paternal Grandfather    Heart disease Paternal Uncle    Heart disease Maternal Aunt    Heart disease Maternal Uncle    Brain cancer Maternal Aunt    Breast cancer Sister     Health Maintenance  Topic Date Due   DTaP/Tdap/Td (1 - Tdap) Never done   COVID-19 Vaccine (5 - 2023-24 season) 10/14/2022 (Originally 09/17/2022)   Zoster Vaccines- Shingrix (1 of 2) 06/14/2023 (Originally 12/13/1959)   Hepatitis C Screening  09/28/2023 (Originally 12/13/1958)   Medicare Annual Wellness (AWV)  04/07/2023   Pneumonia Vaccine 31+ Years old  Completed   INFLUENZA VACCINE  Completed   DEXA SCAN  Completed   HPV VACCINES  Aged Out     ----------------------------------------------------------------------------------------------------------------------------------------------------------------------------------------------------------------- Physical Exam BP 118/67   Pulse 90   Ht 5\' 5"  (1.651 m)   Wt 163 lb (73.9 kg)   SpO2 99%   BMI 27.12 kg/m   Physical Exam Constitutional:      General: She is not in acute distress. HENT:     Head: Normocephalic and atraumatic.     Right Ear: Tympanic membrane and ear canal normal.     Left Ear: Tympanic membrane and ear canal normal.     Nose: Nose normal.  Eyes:     General: No scleral icterus.     Conjunctiva/sclera: Conjunctivae normal.  Neck:     Thyroid: No thyromegaly.  Cardiovascular:     Rate and Rhythm: Normal rate and regular rhythm.     Heart sounds: Normal heart sounds.  Pulmonary:     Effort: Pulmonary effort is normal.     Breath sounds: Normal breath sounds.  Abdominal:     General: Bowel sounds are normal. There is no distension.     Palpations: Abdomen is  soft.     Tenderness: There is no abdominal tenderness. There is no guarding.  Musculoskeletal:        General: Normal range of motion.     Cervical back: Normal range of motion and neck supple.  Lymphadenopathy:     Cervical: No cervical adenopathy.  Skin:    General: Skin is warm and dry.     Findings: No rash.  Neurological:     General: No focal deficit present.     Mental Status: She is alert and oriented to person, place, and time.     Cranial Nerves: No cranial nerve deficit.     Coordination: Coordination normal.  Psychiatric:        Mood and Affect: Mood normal.        Behavior: Behavior normal.     ------------------------------------------------------------------------------------------------------------------------------------------------------------------------------------------------------------------- Assessment and Plan  Well adult exam Well adult Orders Placed This Encounter  Procedures   CBC with Differential/Platelet   CMP14+EGFR   Lipid Panel With LDL/HDL Ratio   B12   Sed Rate (ESR)   Protein Electrophoresis, (serum)   TSH   HgB A1c   Lead, blood    Order Specific Question:   [place] of residence?    Answer:   [place] [620]   Protein Electrophoresis, Urine Rflx.  Screening: per lab order Immunizations:  Flu vaccine given.  Anticipatory guidance/Risk factor reduction:  Recommendatons per AVS.   Neuropathy Additional labs for work up of neuropathy ordered today.     No orders of the defined types were placed in this encounter.   No follow-ups on file.    This  visit occurred during the SARS-CoV-2 public health emergency.  Safety protocols were in place, including screening questions prior to the visit, additional usage of staff PPE, and extensive cleaning of exam room while observing appropriate contact time as indicated for disinfecting solutions.

## 2022-10-04 LAB — HEMOGLOBIN A1C
Est. average glucose Bld gHb Est-mCnc: 114 mg/dL
Hgb A1c MFr Bld: 5.6 % (ref 4.8–5.6)

## 2022-10-04 LAB — PROTEIN ELECTROPHORESIS, URINE REFLEX
Albumin ELP, Urine: 44.5 %
Alpha-1-Globulin, U: 7.9 %
Alpha-2-Globulin, U: 12.3 %
Beta Globulin, U: 20.1 %
Gamma Globulin, U: 15.2 %
Protein, Ur: 15.7 mg/dL

## 2022-10-04 LAB — CMP14+EGFR
ALT: 18 IU/L (ref 0–32)
AST: 26 IU/L (ref 0–40)
Albumin: 4.1 g/dL (ref 3.8–4.8)
Alkaline Phosphatase: 112 IU/L (ref 44–121)
BUN/Creatinine Ratio: 26 (ref 12–28)
BUN: 17 mg/dL (ref 8–27)
Bilirubin Total: 0.6 mg/dL (ref 0.0–1.2)
CO2: 20 mmol/L (ref 20–29)
Calcium: 9.5 mg/dL (ref 8.7–10.3)
Chloride: 103 mmol/L (ref 96–106)
Creatinine, Ser: 0.66 mg/dL (ref 0.57–1.00)
Globulin, Total: 2.2 g/dL (ref 1.5–4.5)
Glucose: 120 mg/dL — ABNORMAL HIGH (ref 70–99)
Potassium: 4.2 mmol/L (ref 3.5–5.2)
Sodium: 140 mmol/L (ref 134–144)
Total Protein: 6.3 g/dL (ref 6.0–8.5)
eGFR: 89 mL/min/{1.73_m2} (ref >59)

## 2022-10-04 LAB — PROTEIN ELECTROPHORESIS, SERUM
A/G Ratio: 1.3 (ref 0.7–1.7)
Albumin ELP: 3.6 g/dL (ref 2.9–4.4)
Alpha 1: 0.2 g/dL (ref 0.0–0.4)
Alpha 2: 0.7 g/dL (ref 0.4–1.0)
Beta: 1 g/dL (ref 0.7–1.3)
Gamma Globulin: 0.8 g/dL (ref 0.4–1.8)
Globulin, Total: 2.7 g/dL (ref 2.2–3.9)

## 2022-10-04 LAB — CBC WITH DIFFERENTIAL/PLATELET
Basophils Absolute: 0 10*3/uL (ref 0.0–0.2)
Basos: 1 %
EOS (ABSOLUTE): 0.1 10*3/uL (ref 0.0–0.4)
Eos: 1 %
Hematocrit: 41.8 % (ref 34.0–46.6)
Hemoglobin: 13.5 g/dL (ref 11.1–15.9)
Immature Grans (Abs): 0 10*3/uL (ref 0.0–0.1)
Immature Granulocytes: 0 %
Lymphocytes Absolute: 2 10*3/uL (ref 0.7–3.1)
Lymphs: 33 %
MCH: 29.2 pg (ref 26.6–33.0)
MCHC: 32.3 g/dL (ref 31.5–35.7)
MCV: 91 fL (ref 79–97)
Monocytes Absolute: 0.6 10*3/uL (ref 0.1–0.9)
Monocytes: 10 %
Neutrophils Absolute: 3.3 10*3/uL (ref 1.4–7.0)
Neutrophils: 55 %
Platelets: 262 10*3/uL (ref 150–450)
RBC: 4.62 x10E6/uL (ref 3.77–5.28)
RDW: 13.1 % (ref 11.7–15.4)
WBC: 5.9 10*3/uL (ref 3.4–10.8)

## 2022-10-04 LAB — LIPID PANEL WITH LDL/HDL RATIO
Cholesterol, Total: 247 mg/dL — ABNORMAL HIGH (ref 100–199)
HDL: 65 mg/dL (ref >39)
LDL Chol Calc (NIH): 149 mg/dL — ABNORMAL HIGH (ref 0–99)
LDL/HDL Ratio: 2.3 ratio (ref 0.0–3.2)
Triglycerides: 184 mg/dL — ABNORMAL HIGH (ref 0–149)
VLDL Cholesterol Cal: 33 mg/dL (ref 5–40)

## 2022-10-04 LAB — TSH: TSH: 0.846 u[IU]/mL (ref 0.450–4.500)

## 2022-10-04 LAB — VITAMIN B12: Vitamin B-12: 1208 pg/mL (ref 232–1245)

## 2022-10-04 LAB — SEDIMENTATION RATE: Sed Rate: 14 mm/h (ref 0–40)

## 2022-10-04 LAB — LEAD, BLOOD (ADULT >= 16 YRS): Lead-Whole Blood: 1.3 ug/dL (ref 0.0–3.4)

## 2022-10-26 ENCOUNTER — Other Ambulatory Visit: Payer: Self-pay | Admitting: Family Medicine

## 2022-12-06 DIAGNOSIS — H52223 Regular astigmatism, bilateral: Secondary | ICD-10-CM | POA: Diagnosis not present

## 2022-12-06 DIAGNOSIS — H40013 Open angle with borderline findings, low risk, bilateral: Secondary | ICD-10-CM | POA: Diagnosis not present

## 2022-12-06 DIAGNOSIS — H35373 Puckering of macula, bilateral: Secondary | ICD-10-CM | POA: Diagnosis not present

## 2022-12-06 DIAGNOSIS — H25813 Combined forms of age-related cataract, bilateral: Secondary | ICD-10-CM | POA: Diagnosis not present

## 2023-01-18 DIAGNOSIS — H02413 Mechanical ptosis of bilateral eyelids: Secondary | ICD-10-CM | POA: Diagnosis not present

## 2023-01-18 DIAGNOSIS — H524 Presbyopia: Secondary | ICD-10-CM | POA: Diagnosis not present

## 2023-01-18 DIAGNOSIS — H04129 Dry eye syndrome of unspecified lacrimal gland: Secondary | ICD-10-CM | POA: Diagnosis not present

## 2023-01-18 DIAGNOSIS — H15112 Episcleritis periodica fugax, left eye: Secondary | ICD-10-CM | POA: Diagnosis not present

## 2023-01-18 DIAGNOSIS — H25013 Cortical age-related cataract, bilateral: Secondary | ICD-10-CM | POA: Diagnosis not present

## 2023-01-18 DIAGNOSIS — H40013 Open angle with borderline findings, low risk, bilateral: Secondary | ICD-10-CM | POA: Diagnosis not present

## 2023-01-19 DIAGNOSIS — H15122 Nodular episcleritis, left eye: Secondary | ICD-10-CM | POA: Diagnosis not present

## 2023-01-19 DIAGNOSIS — H52223 Regular astigmatism, bilateral: Secondary | ICD-10-CM | POA: Diagnosis not present

## 2023-01-19 DIAGNOSIS — H25813 Combined forms of age-related cataract, bilateral: Secondary | ICD-10-CM | POA: Diagnosis not present

## 2023-01-25 ENCOUNTER — Other Ambulatory Visit: Payer: Self-pay | Admitting: Family Medicine

## 2023-01-25 DIAGNOSIS — H52221 Regular astigmatism, right eye: Secondary | ICD-10-CM | POA: Diagnosis not present

## 2023-01-25 DIAGNOSIS — E785 Hyperlipidemia, unspecified: Secondary | ICD-10-CM | POA: Diagnosis not present

## 2023-01-25 DIAGNOSIS — H25811 Combined forms of age-related cataract, right eye: Secondary | ICD-10-CM | POA: Diagnosis not present

## 2023-01-25 DIAGNOSIS — Z79899 Other long term (current) drug therapy: Secondary | ICD-10-CM | POA: Diagnosis not present

## 2023-02-07 ENCOUNTER — Other Ambulatory Visit: Payer: Self-pay | Admitting: Family Medicine

## 2023-02-07 DIAGNOSIS — F325 Major depressive disorder, single episode, in full remission: Secondary | ICD-10-CM

## 2023-02-27 DIAGNOSIS — E042 Nontoxic multinodular goiter: Secondary | ICD-10-CM | POA: Diagnosis not present

## 2023-02-27 DIAGNOSIS — H2181 Floppy iris syndrome: Secondary | ICD-10-CM | POA: Diagnosis not present

## 2023-02-27 DIAGNOSIS — H5703 Miosis: Secondary | ICD-10-CM | POA: Diagnosis not present

## 2023-02-27 DIAGNOSIS — H40013 Open angle with borderline findings, low risk, bilateral: Secondary | ICD-10-CM | POA: Diagnosis not present

## 2023-02-27 DIAGNOSIS — H35373 Puckering of macula, bilateral: Secondary | ICD-10-CM | POA: Diagnosis not present

## 2023-02-27 DIAGNOSIS — E785 Hyperlipidemia, unspecified: Secondary | ICD-10-CM | POA: Diagnosis not present

## 2023-02-27 DIAGNOSIS — H15122 Nodular episcleritis, left eye: Secondary | ICD-10-CM | POA: Diagnosis not present

## 2023-02-27 DIAGNOSIS — Z961 Presence of intraocular lens: Secondary | ICD-10-CM | POA: Diagnosis not present

## 2023-02-27 DIAGNOSIS — H52223 Regular astigmatism, bilateral: Secondary | ICD-10-CM | POA: Diagnosis not present

## 2023-02-27 DIAGNOSIS — H25812 Combined forms of age-related cataract, left eye: Secondary | ICD-10-CM | POA: Diagnosis not present

## 2023-03-02 ENCOUNTER — Other Ambulatory Visit: Payer: Self-pay | Admitting: Family Medicine

## 2023-03-02 DIAGNOSIS — F325 Major depressive disorder, single episode, in full remission: Secondary | ICD-10-CM

## 2023-03-09 ENCOUNTER — Telehealth: Payer: Self-pay

## 2023-03-09 DIAGNOSIS — F325 Major depressive disorder, single episode, in full remission: Secondary | ICD-10-CM

## 2023-03-09 NOTE — Telephone Encounter (Signed)
 Copied from CRM [id number]. Topic: Clinical - Prescription Issue >> Mar 09, 2023  2:56 PM [name] wrote: Reason for CRM: Patient is calling in about a prescription for FLUoxetine (PROZAC) 20 MG capsule and FLUoxetine (PROZAC) 40 MG capsule  . [facility] informed the patient that the provider is not responding to their request for refills.Patient has been out of her medicine for 2 weeks. It is best to contact the patient by phone at [phone]. Listed below is the patient preferred pharmacy.  [facility] [address] [place] [address] Phone: [phone] Fax: [phone]

## 2023-03-12 MED ORDER — FLUOXETINE HCL 20 MG PO CAPS: 20.0000 mg | ORAL_CAPSULE | Freq: Every day | ORAL | 3 refills | Status: AC

## 2023-03-12 MED ORDER — FLUOXETINE HCL 40 MG PO CAPS: 40.0000 mg | ORAL_CAPSULE | Freq: Every day | ORAL | 3 refills | Status: DC

## 2023-05-10 ENCOUNTER — Ambulatory Visit

## 2023-05-10 VITALS — Ht 65.75 in | Wt 155.0 lb

## 2023-05-10 DIAGNOSIS — Z0000 Encounter for general adult medical examination without abnormal findings: Secondary | ICD-10-CM

## 2023-05-10 NOTE — Patient Instructions (Signed)
  [name] , Thank you for taking time to come for your Medicare Wellness Visit. I appreciate your ongoing commitment to your health goals. Please review the following plan we discussed and let me know if I can assist you in the future.   These are the goals we discussed:  Goals       DIET - INCREASE WATER INTAKE      She would like to drink more water and exercise more.       Patient Stated (pt-stated)      Patient stated that she would like to continue to maintain her healthy lifestyle.        This is a list of the screening recommended for you and due dates:  Health Maintenance  Topic Date Due   DTaP/Tdap/Td vaccine (1 - Tdap) Never done   Mammogram  08/19/2022   COVID-19 Vaccine (5 - 2024-25 season) 09/17/2022   Zoster (Shingles) Vaccine (1 of 2) 06/14/2023*   Flu Shot  08/17/2023   Medicare Annual Wellness Visit  05/09/2024   Pneumonia Vaccine  Completed   DEXA scan (bone density measurement)  Completed   HPV Vaccine  Aged Out   Meningitis B Vaccine  Aged Out  *Topic was postponed. The date shown is not the original due date.

## 2023-05-10 NOTE — Progress Notes (Signed)
 Subjective:   [name] is a 83 y.o. female who presents for Medicare Annual (Subsequent) preventive examination.  Visit Complete: Virtual I connected with  Darien Eden on 05/10/23 by a audio enabled telemedicine application and verified that I am speaking with the correct person using two identifiers.  Patient Location: Home  Provider Location: Office/Clinic  I discussed the limitations of evaluation and management by telemedicine. The patient expressed understanding and agreed to proceed.  Vital Signs: Because this visit was a virtual/telehealth visit, some criteria may be missing or patient reported. Any vitals not documented were not able to be obtained and vitals that have been documented are patient reported.  Patient Medicare AWV questionnaire was completed by the patient on 05/09/2023; I have confirmed that all information answered by patient is correct and no changes since this date.  Cardiac Risk Factors include: advanced age (>28men, >74 women);dyslipidemia;family history of premature cardiovascular disease     Objective:    Today's Vitals   05/10/23 1100  Weight: 155 lb (70.3 kg)  Height: 5' 5.75" (1.67 m)   Body mass index is 25.21 kg/m.     05/10/2023   11:08 AM 04/07/2022   10:59 AM 07/29/2019   10:44 AM 04/20/2014    2:05 PM 04/06/2014    1:23 PM  Advanced Directives  Does Patient Have a Medical Advance Directive? Yes Yes Yes Yes Yes  Type of Estate agent of [place];Living will Living will Healthcare Power of [place];Living will;Out of facility DNR (pink MOST or yellow form) Healthcare Power of [place];Living will Healthcare Power of [place];Living will  Does patient want to make changes to medical advance directive? No - Patient declined No - Patient declined No - Patient declined    Copy of Healthcare Power of Attorney in Chart? No - copy requested        Current Medications (verified) Outpatient Encounter Medications as of  05/10/2023  Medication Sig   aspirin EC 81 MG tablet Take 81 mg by mouth.   Biotin 3 MG TABS Take by mouth.   calcium-vitamin D (OSCAL) 250-125 MG-UNIT per tablet Take 1,200 mg by mouth.   Cholecalciferol (VITAMIN D3) 250 MCG (10000 UT) capsule    Cyanocobalamin  (B-12) 1000 MCG CAPS Take 500 mcg by mouth.    FLUoxetine  (PROZAC ) 20 MG capsule Take 1 capsule (20 mg total) by mouth daily.   FLUoxetine  (PROZAC ) 40 MG capsule Take 1 capsule (40 mg total) by mouth daily.   Magnesium Sulfate 70 MG CAPS Take 500 mg by mouth.   Multiple Vitamins-Minerals (MULTIVITAMIN WITH MINERALS) tablet Take 1 tablet by mouth daily.   No facility-administered encounter medications on file as of 05/10/2023.    Allergies (verified) Other   History: Past Medical History:  Diagnosis Date   Allergy    Basal cell carcinoma    Cancer (HCC)    basal cell    Cataract    beginning   Chronic kidney disease    kidney stones   Essential tremor    Past Surgical History:  Procedure Laterality Date   APPENDECTOMY  1962   BASAL CELL CARCINOMA EXCISION  2003   BLEPHAROPLASTY  2015   upper/lower    BREAST FIBROADENOMA SURGERY  1998   CARDIAC CATHETERIZATION  2009   CHOLECYSTECTOMY  2003   COLONOSCOPY  2006   CYSTOSCOPY  2008   with stent   DILATION AND CURETTAGE OF UTERUS  1973   LITHOTRIPSY  2008   LITHOTRIPSY  2015   SIGMOIDOSCOPY  2010   TONSILLECTOMY  1951   TUBAL LIGATION  1976   Family History  Problem Relation Age of Onset   Heart disease Mother    Diabetes Mother    Kidney disease Mother    Hypertension Mother    Heart disease Father 66   Heart attack Father    Stroke Father    Colon cancer Paternal Aunt    Heart disease Paternal Uncle 4        passed away 79    Heart disease Paternal Grandmother    Heart disease Paternal Grandfather    Heart disease Paternal Uncle    Heart disease Maternal Aunt    Heart disease Maternal Uncle    Brain cancer Maternal Aunt    Breast cancer Sister     Social History   Socioeconomic History   Marital status: Married    Spouse name: Daneil Dunker   Number of children: 2   Years of education: 12   Highest education level: 12th grade  Occupational History   Occupation: Retired  Tobacco Use   Smoking status: Never   Smokeless tobacco: Never  Vaping Use   Vaping status: Never Used  Substance and Sexual Activity   Alcohol use: Yes    Comment: 2 glasses of wine monthly   Drug use: No   Sexual activity: Not Currently    Partners: Male  Other Topics Concern   Not on file  Social History Narrative   Lives with husband. She has two children. She enjoys reading and gardening.   Social Drivers of Corporate investment banker Strain: Low Risk  (05/10/2023)   Overall Financial Resource Strain (CARDIA)    Difficulty of Paying Living Expenses: Not hard at all  Food Insecurity: No Food Insecurity (05/10/2023)   Hunger Vital Sign    Worried About Running Out of Food in the Last Year: Never true    Ran Out of Food in the Last Year: Never true  Transportation Needs: No Transportation Needs (05/10/2023)   PRAPARE - Administrator, Civil Service (Medical): No    Lack of Transportation (Non-Medical): No  Physical Activity: Inactive (05/10/2023)   Exercise Vital Sign    Days of Exercise per Week: 0 days    Minutes of Exercise per Session: 0 min  Stress: No Stress Concern Present (05/10/2023)   [employer] of Occupational Health - Occupational Stress Questionnaire    Feeling of Stress : Not at all  Recent Concern: Stress - Stress Concern Present (05/09/2023)   [employer] of Occupational Health - Occupational Stress Questionnaire    Feeling of Stress : To some extent  Social Connections: Moderately Integrated (05/10/2023)   Social Connection and Isolation Panel [NHANES]    Frequency of Communication with Friends and Family: Twice a week    Frequency of Social Gatherings with Friends and Family: Once a week    Attends  Religious Services: More than 4 times per year    Active Member of [employer] or Organizations: No    Attends Engineer, structural: Never    Marital Status: Married    Tobacco Counseling Counseling given: Not Answered   Clinical Intake:  Pre-visit preparation completed: Yes  Pain : No/denies pain     BMI - recorded: 25.21 Nutritional Status: BMI 25 -29 Overweight Nutritional Risks: None Diabetes: No  How often do you need to have someone help you when you read instructions, pamphlets, or other written materials  from your doctor or pharmacy?: 1 - Never What is the last grade level you completed in school?: 12  Interpreter Needed?: No      Activities of Daily Living    05/10/2023   11:02 AM 05/09/2023    3:59 PM  In your present state of health, do you have any difficulty performing the following activities:  Hearing? 0 0  Vision? 0 0  Difficulty concentrating or making decisions? 0 0  Walking or climbing stairs? 0 0  Dressing or bathing? 0 0  Doing errands, shopping? 0 0  Preparing Food and eating ? N N  Using the Toilet? N N  In the past six months, have you accidently leaked urine? Y Y  Do you have problems with loss of bowel control? N N  Managing your Medications? N N  Managing your Finances? N N  Housekeeping or managing your Housekeeping? N N    Patient Care Team: Adela Holter, DO as PCP - General (Family Medicine) Jorizzo, Laurine Pore, MD as Referring Physician (Dermatology) April Knack, Amy V, OD (Optometry)  Indicate any recent Medical Services you may have received from other than [facility] providers in the past year (date may be approximate).     Assessment:   This is a routine wellness examination for [facility].  Hearing/Vision screen No results found.   Goals Addressed             This Visit's Progress    DIET - INCREASE WATER INTAKE       She would like to drink more water and exercise more.       Depression Screen    05/10/2023    11:06 AM 04/07/2022   10:59 AM 03/16/2022   11:34 AM 01/02/2022    1:55 PM 01/02/2022    1:54 PM 08/31/2021    2:00 PM 08/31/2021    1:50 PM  PHQ 2/9 Scores  PHQ - 2 Score 0 0 0 0 0 0 0  PHQ- 9 Score    0  0     Fall Risk    05/10/2023   11:08 AM 05/09/2023    3:59 PM 04/06/2023    9:45 AM 04/07/2022   10:59 AM 03/16/2022   11:34 AM  Fall Risk   Falls in the past year? 0 0 0 1 0  Number falls in past yr: 0 0 0 0 0  Injury with Fall? 0 0 0 0 0  Risk for fall due to : No Fall Risks   History of fall(s) No Fall Risks  Follow up Falls evaluation completed   Falls evaluation completed;Education provided;Falls prevention discussed Falls evaluation completed    MEDICARE RISK AT HOME: Medicare Risk at Home Any stairs in or around the home?: Yes If so, are there any without handrails?: No Home free of loose throw rugs in walkways, pet beds, electrical cords, etc?: Yes Adequate lighting in your home to reduce risk of falls?: Yes Life alert?: No Use of a cane, walker or w/c?: No Grab bars in the bathroom?: Yes Shower chair or bench in shower?: No Elevated toilet seat or a handicapped toilet?: Yes  TIMED UP AND GO:  Was the test performed?  No    Cognitive Function:        05/10/2023   11:09 AM 04/07/2022   11:09 AM  6CIT Screen  What Year? 0 points 0 points  What month? 0 points 0 points  What time? 0 points 0 points  Count back from  20 0 points 0 points  Months in reverse 0 points 0 points  Repeat phrase 0 points 0 points  Total Score 0 points 0 points    Immunizations Immunization History  Administered Date(s) Administered   Fluad Quad(high Dose 65+) 10/15/2019, 11/26/2020, 01/02/2022   Fluad Trivalent(High Dose 65+) 09/28/2022   PFIZER(Purple Top)SARS-COV-2 Vaccination 01/26/2019, 02/17/2019, 10/21/2019   Pfizer(Comirnaty)Fall Seasonal Vaccine 12 years and older 01/02/2022   Pneumococcal Conjugate-13 09/09/2014   Pneumococcal Polysaccharide-23 11/11/2009   Zoster,  Live 07/23/2008    TDAP status: Due, Education has been provided regarding the importance of this vaccine. Advised may receive this vaccine at local pharmacy or Health Dept. Aware to provide a copy of the vaccination record if obtained from local pharmacy or Health Dept. Verbalized acceptance and understanding.  Flu Vaccine status: Up to date  Pneumococcal vaccine status: Up to date  Covid-19 vaccine status: Information provided on how to obtain vaccines.   Qualifies for Shingles Vaccine? Yes   Zostavax completed Yes   Shingrix Completed?: No.    Education has been provided regarding the importance of this vaccine. Patient has been advised to call insurance company to determine out of pocket expense if they have not yet received this vaccine. Advised may also receive vaccine at local pharmacy or Health Dept. Verbalized acceptance and understanding.  Screening Tests Health Maintenance  Topic Date Due   DTaP/Tdap/Td (1 - Tdap) Never done   MAMMOGRAM  08/19/2022   COVID-19 Vaccine (5 - 2024-25 season) 09/17/2022   Zoster Vaccines- Shingrix (1 of 2) 06/14/2023 (Originally 12/13/1959)   INFLUENZA VACCINE  08/17/2023   Medicare Annual Wellness (AWV)  05/09/2024   Pneumonia Vaccine 12+ Years old  Completed   DEXA SCAN  Completed   HPV VACCINES  Aged Out   Meningococcal B Vaccine  Aged Out    Health Maintenance  Health Maintenance Due  Topic Date Due   DTaP/Tdap/Td (1 - Tdap) Never done   MAMMOGRAM  08/19/2022   COVID-19 Vaccine (5 - 2024-25 season) 09/17/2022    Colorectal cancer screening: No longer required.   Mammogram status: Ordered Scheduled for June. Pt provided with contact info and advised to call to schedule appt.   Bone Density status: Ordered 05/10/2023. Pt provided with contact info and advised to call to schedule appt.  Lung Cancer Screening: (Low Dose CT Chest recommended if Age 58-80 years, 20 pack-year currently smoking OR have quit w/in 15years.) does not  qualify.   Lung Cancer Screening Referral: n/a  Additional Screening:  Hepatitis C Screening: does not qualify; Completed   Vision Screening: Recommended annual ophthalmology exams for early detection of glaucoma and other disorders of the eye. Is the patient up to date with their annual eye exam?  Yes  Who is the provider or what is the name of the office in which the patient attends annual eye exams? Dr April Knack If pt is not established with a provider, would they like to be referred to a provider to establish care?  N/a .   Dental Screening: Recommended annual dental exams for proper oral hygiene   Community Resource Referral / Chronic Care Management: CRR required this visit?  No   CCM required this visit?  No     Plan:     I have personally reviewed and noted the following in the patient's chart:   Medical and social history Use of alcohol, tobacco or illicit drugs  Current medications and supplements including opioid prescriptions. Patient is not currently taking  opioid prescriptions. Functional ability and status Nutritional status Physical activity Advanced directives List of other physicians Hospitalizations, surgeries, and ER visits in previous 12 months. None Vitals Screenings to include cognitive, depression, and falls Referrals and appointments  In addition, I have reviewed and discussed with patient certain preventive protocols, quality metrics, and best practice recommendations. A written personalized care plan for preventive services as well as general preventive health recommendations were provided to patient.     Aubrey Leaf, CMA   05/10/2023   After Visit Summary: (MyChart) Due to this being a telephonic visit, the after visit summary with patients personalized plan was offered to patient via MyChart   Nurse Notes:   [name] is a 83 y.o. female patient of Adela Holter, DO who had a Medicare Annual Wellness Visit today via telephone.  Yeily is Retired and lives with their spouse. She has 2 children. She reports that she is socially active and does interact with friends/family regularly. She is minimally physically active and enjoys reading.

## 2023-06-08 ENCOUNTER — Other Ambulatory Visit: Payer: Self-pay | Admitting: Family Medicine

## 2023-06-08 DIAGNOSIS — F325 Major depressive disorder, single episode, in full remission: Secondary | ICD-10-CM

## 2023-07-09 DIAGNOSIS — R92323 Mammographic fibroglandular density, bilateral breasts: Secondary | ICD-10-CM | POA: Diagnosis not present

## 2023-07-09 DIAGNOSIS — Z1231 Encounter for screening mammogram for malignant neoplasm of breast: Secondary | ICD-10-CM | POA: Diagnosis not present

## 2023-07-25 DIAGNOSIS — L578 Other skin changes due to chronic exposure to nonionizing radiation: Secondary | ICD-10-CM | POA: Diagnosis not present

## 2023-07-25 DIAGNOSIS — Z85828 Personal history of other malignant neoplasm of skin: Secondary | ICD-10-CM | POA: Diagnosis not present

## 2023-07-25 DIAGNOSIS — L821 Other seborrheic keratosis: Secondary | ICD-10-CM | POA: Diagnosis not present

## 2023-07-25 DIAGNOSIS — D229 Melanocytic nevi, unspecified: Secondary | ICD-10-CM | POA: Diagnosis not present

## 2023-07-25 DIAGNOSIS — L814 Other melanin hyperpigmentation: Secondary | ICD-10-CM | POA: Diagnosis not present

## 2023-08-16 DIAGNOSIS — H40013 Open angle with borderline findings, low risk, bilateral: Secondary | ICD-10-CM | POA: Diagnosis not present

## 2023-08-16 DIAGNOSIS — H25811 Combined forms of age-related cataract, right eye: Secondary | ICD-10-CM | POA: Diagnosis not present

## 2023-08-16 DIAGNOSIS — H04129 Dry eye syndrome of unspecified lacrimal gland: Secondary | ICD-10-CM | POA: Diagnosis not present

## 2023-08-16 DIAGNOSIS — H524 Presbyopia: Secondary | ICD-10-CM | POA: Diagnosis not present

## 2023-08-16 DIAGNOSIS — H15843 Scleral ectasia, bilateral: Secondary | ICD-10-CM | POA: Diagnosis not present

## 2023-08-16 DIAGNOSIS — H15112 Episcleritis periodica fugax, left eye: Secondary | ICD-10-CM | POA: Diagnosis not present

## 2023-08-16 DIAGNOSIS — H02413 Mechanical ptosis of bilateral eyelids: Secondary | ICD-10-CM | POA: Diagnosis not present

## 2023-08-30 DIAGNOSIS — H526 Other disorders of refraction: Secondary | ICD-10-CM | POA: Diagnosis not present

## 2023-08-30 DIAGNOSIS — H35373 Puckering of macula, bilateral: Secondary | ICD-10-CM | POA: Diagnosis not present

## 2023-08-30 DIAGNOSIS — Z961 Presence of intraocular lens: Secondary | ICD-10-CM | POA: Diagnosis not present

## 2023-08-30 DIAGNOSIS — H40013 Open angle with borderline findings, low risk, bilateral: Secondary | ICD-10-CM | POA: Diagnosis not present

## 2023-08-30 DIAGNOSIS — H26493 Other secondary cataract, bilateral: Secondary | ICD-10-CM | POA: Diagnosis not present

## 2023-09-18 ENCOUNTER — Encounter: Payer: Self-pay | Admitting: Sports Medicine

## 2023-09-24 DIAGNOSIS — H26493 Other secondary cataract, bilateral: Secondary | ICD-10-CM | POA: Diagnosis not present

## 2023-10-16 DIAGNOSIS — H26491 Other secondary cataract, right eye: Secondary | ICD-10-CM | POA: Diagnosis not present

## 2023-11-06 DIAGNOSIS — H26493 Other secondary cataract, bilateral: Secondary | ICD-10-CM | POA: Diagnosis not present

## 2023-11-06 DIAGNOSIS — H02413 Mechanical ptosis of bilateral eyelids: Secondary | ICD-10-CM | POA: Diagnosis not present

## 2023-11-06 DIAGNOSIS — H25812 Combined forms of age-related cataract, left eye: Secondary | ICD-10-CM | POA: Diagnosis not present

## 2023-11-06 DIAGNOSIS — H04129 Dry eye syndrome of unspecified lacrimal gland: Secondary | ICD-10-CM | POA: Diagnosis not present

## 2023-11-06 DIAGNOSIS — H15843 Scleral ectasia, bilateral: Secondary | ICD-10-CM | POA: Diagnosis not present

## 2023-11-06 DIAGNOSIS — H15112 Episcleritis periodica fugax, left eye: Secondary | ICD-10-CM | POA: Diagnosis not present

## 2023-11-06 DIAGNOSIS — H524 Presbyopia: Secondary | ICD-10-CM | POA: Diagnosis not present

## 2023-11-06 DIAGNOSIS — H40013 Open angle with borderline findings, low risk, bilateral: Secondary | ICD-10-CM | POA: Diagnosis not present

## 2023-11-06 DIAGNOSIS — H25811 Combined forms of age-related cataract, right eye: Secondary | ICD-10-CM | POA: Diagnosis not present

## 2024-05-13 ENCOUNTER — Ambulatory Visit
